# Patient Record
Sex: Female | Born: 1980 | Race: White | Hispanic: No | Smoking: Never smoker
Health system: Southern US, Community
[De-identification: ages and names within clinical notes are randomized; demographics above are authoritative.]

## PROBLEM LIST (undated history)

## (undated) DIAGNOSIS — C6990 Malignant neoplasm of unspecified site of unspecified eye: Secondary | ICD-10-CM

## (undated) HISTORY — PX: BREAST BIOPSY: SHX20

---

## 2016-09-05 DIAGNOSIS — R590 Localized enlarged lymph nodes: Secondary | ICD-10-CM | POA: Insufficient documentation

## 2016-09-05 DIAGNOSIS — C6932 Malignant neoplasm of left choroid: Secondary | ICD-10-CM | POA: Insufficient documentation

## 2018-04-20 DIAGNOSIS — G479 Sleep disorder, unspecified: Secondary | ICD-10-CM | POA: Insufficient documentation

## 2018-04-20 DIAGNOSIS — I7300 Raynaud's syndrome without gangrene: Secondary | ICD-10-CM | POA: Insufficient documentation

## 2018-04-20 DIAGNOSIS — C6992 Malignant neoplasm of unspecified site of left eye: Secondary | ICD-10-CM | POA: Insufficient documentation

## 2018-04-20 DIAGNOSIS — Z975 Presence of (intrauterine) contraceptive device: Secondary | ICD-10-CM | POA: Insufficient documentation

## 2018-04-20 DIAGNOSIS — Z9889 Other specified postprocedural states: Secondary | ICD-10-CM | POA: Insufficient documentation

## 2018-09-05 ENCOUNTER — Other Ambulatory Visit: Payer: Self-pay | Admitting: Medical Oncology

## 2018-09-05 DIAGNOSIS — C6932 Malignant neoplasm of left choroid: Secondary | ICD-10-CM

## 2018-09-26 ENCOUNTER — Ambulatory Visit
Admission: RE | Admit: 2018-09-26 | Discharge: 2018-09-26 | Disposition: A | Discharge status: Home / Self Care | Payer: PRIVATE HEALTH INSURANCE | Source: Ambulatory Visit | Attending: Medical Oncology | Admitting: Medical Oncology

## 2018-09-26 ENCOUNTER — Other Ambulatory Visit: Payer: Self-pay | Admitting: Medical Oncology

## 2018-09-26 ENCOUNTER — Ambulatory Visit
Admission: RE | Admit: 2018-09-26 | Discharge: 2018-09-26 | Disposition: A | Discharge status: Home / Self Care | Payer: Self-pay | Source: Ambulatory Visit | Attending: Medical Oncology | Admitting: Medical Oncology

## 2018-09-26 DIAGNOSIS — C6932 Malignant neoplasm of left choroid: Secondary | ICD-10-CM

## 2018-09-26 MED ORDER — GADOXETATE DISODIUM 0.25 MMOL/ML IV SOLN
7.0000 mL | Freq: Once | INTRAVENOUS | Status: AC | PRN
  Administered 2018-09-26: 7 mL via INTRAVENOUS

## 2019-04-10 ENCOUNTER — Other Ambulatory Visit: Payer: Self-pay | Admitting: Medical Oncology

## 2019-04-10 DIAGNOSIS — C6932 Malignant neoplasm of left choroid: Secondary | ICD-10-CM

## 2019-04-15 ENCOUNTER — Other Ambulatory Visit: Payer: Self-pay | Admitting: Medical Oncology

## 2019-04-19 ENCOUNTER — Inpatient Hospital Stay: Admission: RE | Admit: 2019-04-19 | Payer: PRIVATE HEALTH INSURANCE | Source: Ambulatory Visit

## 2019-04-19 ENCOUNTER — Other Ambulatory Visit: Payer: PRIVATE HEALTH INSURANCE

## 2019-05-03 ENCOUNTER — Ambulatory Visit
Admission: RE | Admit: 2019-05-03 | Discharge: 2019-05-03 | Disposition: A | Discharge status: Home / Self Care | Payer: PRIVATE HEALTH INSURANCE | Source: Ambulatory Visit | Attending: Medical Oncology | Admitting: Medical Oncology

## 2019-05-03 ENCOUNTER — Other Ambulatory Visit: Payer: Self-pay

## 2019-05-03 DIAGNOSIS — C6932 Malignant neoplasm of left choroid: Secondary | ICD-10-CM

## 2019-05-03 MED ORDER — GADOXETATE DISODIUM 0.25 MMOL/ML IV SOLN
7.0000 mL | Freq: Once | INTRAVENOUS | Status: AC | PRN
  Administered 2019-05-03: 7 mL via INTRAVENOUS

## 2019-10-21 ENCOUNTER — Other Ambulatory Visit: Payer: Self-pay | Admitting: Family

## 2019-10-21 DIAGNOSIS — C6932 Malignant neoplasm of left choroid: Secondary | ICD-10-CM

## 2019-10-22 ENCOUNTER — Ambulatory Visit
Admission: RE | Admit: 2019-10-22 | Discharge: 2019-10-22 | Disposition: A | Discharge status: Home / Self Care | Payer: PRIVATE HEALTH INSURANCE | Source: Ambulatory Visit | Attending: Family | Admitting: Family

## 2019-10-22 ENCOUNTER — Other Ambulatory Visit: Payer: Self-pay

## 2019-10-22 DIAGNOSIS — C6932 Malignant neoplasm of left choroid: Secondary | ICD-10-CM

## 2019-10-22 MED ORDER — GADOXETATE DISODIUM 0.25 MMOL/ML IV SOLN
7.0000 mL | Freq: Once | INTRAVENOUS | Status: AC | PRN
  Administered 2019-10-22: 7 mL via INTRAVENOUS

## 2019-10-25 ENCOUNTER — Encounter (HOSPITAL_COMMUNITY): Payer: Self-pay

## 2019-10-25 ENCOUNTER — Other Ambulatory Visit: Payer: Self-pay

## 2019-10-25 ENCOUNTER — Ambulatory Visit (HOSPITAL_COMMUNITY)
Admission: EM | Admit: 2019-10-25 | Discharge: 2019-10-25 | Disposition: A | Discharge status: Home / Self Care | Payer: PRIVATE HEALTH INSURANCE | Attending: Urgent Care | Admitting: Urgent Care

## 2019-10-25 DIAGNOSIS — Z20822 Contact with and (suspected) exposure to covid-19: Secondary | ICD-10-CM

## 2019-10-25 DIAGNOSIS — Z20828 Contact with and (suspected) exposure to other viral communicable diseases: Secondary | ICD-10-CM

## 2019-10-25 HISTORY — DX: Malignant neoplasm of unspecified site of unspecified eye: C69.90

## 2019-10-25 NOTE — ED Triage Notes (Signed)
Pt presents for covid testing with no symptoms; for travel.

## 2019-10-25 NOTE — ED Provider Notes (Signed)
  [place]   MRN: [id number] DOB: [birth year]  Subjective:   Erin Lee is a 38 y.o. female presenting for COVID-19 testing.  She has some exposure to the general public.  Patient is planning on traveling next week and is required to have COVID-19 testing.  Denies taking any chronic medications.   Allergies  Allergen Reactions  . Sulfa Antibiotics     Past Medical History:  Diagnosis Date  . Eye cancer [facility])      Denies pmh, psh.   Social History   Tobacco Use  . Smoking status: Never Smoker  . Smokeless tobacco: Never Used  Substance Use Topics  . Alcohol use: Not on file  . Drug use: Not on file    Review of Systems  Constitutional: Negative for fever and malaise/fatigue.  HENT: Negative for congestion, ear pain, sinus pain and sore throat.   Eyes: Negative for discharge and redness.  Respiratory: Negative for cough, hemoptysis, shortness of breath and wheezing.   Cardiovascular: Negative for chest pain.  Gastrointestinal: Negative for abdominal pain, diarrhea, nausea and vomiting.  Genitourinary: Negative for dysuria, flank pain and hematuria.  Musculoskeletal: Negative for myalgias.  Skin: Negative for rash.  Neurological: Negative for dizziness, weakness and headaches.  Psychiatric/Behavioral: Negative for depression and substance abuse.     Objective:   Vitals: BP 124/77 (BP Location: Left Arm)   Pulse 68   Temp 98.5 F (36.9 C) (Oral)   Resp 18   SpO2 100%   Physical Exam Constitutional:      General: She is not in acute distress.    Appearance: Normal appearance. She is well-developed. She is not ill-appearing.  HENT:     Head: Normocephalic and atraumatic.     Nose: Nose normal.     Mouth/Throat:     Mouth: Mucous membranes are moist.     Pharynx: Oropharynx is clear.  Eyes:     General: No scleral icterus.    Extraocular Movements: Extraocular movements intact.     Pupils: Pupils are equal, round, and reactive to light.   Cardiovascular:     Rate and Rhythm: Normal rate.  Pulmonary:     Effort: Pulmonary effort is normal.  Skin:    General: Skin is warm and dry.  Neurological:     General: No focal deficit present.     Mental Status: She is alert and oriented to person, place, and time.  Psychiatric:        Mood and Affect: Mood normal.        Behavior: Behavior normal.      Assessment and Plan :   1. Exposure to COVID-19 virus     Counseled patient on nature of COVID-19 including modes of transmission, diagnostic testing, management and supportive care.  Counseled on medications used for symptomatic relief. COVID 19 testing is pending. Counseled patient on potential for adverse effects with medications prescribed/recommended today, ER and return-to-clinic precautions discussed, patient verbalized understanding.     Jaynee Eagles, PA-C 10/25/19 1559

## 2019-10-25 NOTE — Discharge Instructions (Signed)
We will let you know about your COVID results as soon as they are available.

## 2019-10-27 LAB — NOVEL CORONAVIRUS, NAA (HOSP ORDER, SEND-OUT TO REF LAB; TAT 18-24 HRS): SARS-CoV-2, NAA: NOT DETECTED

## 2019-12-16 ENCOUNTER — Other Ambulatory Visit: Payer: Self-pay | Admitting: Obstetrics and Gynecology

## 2019-12-16 ENCOUNTER — Other Ambulatory Visit: Payer: Self-pay | Admitting: Obstetrics & Gynecology

## 2019-12-16 DIAGNOSIS — Z1231 Encounter for screening mammogram for malignant neoplasm of breast: Secondary | ICD-10-CM

## 2020-01-24 ENCOUNTER — Ambulatory Visit
Admission: RE | Admit: 2020-01-24 | Discharge: 2020-01-24 | Disposition: A | Discharge status: Home / Self Care | Payer: BC Managed Care – PPO | Source: Ambulatory Visit | Attending: Obstetrics and Gynecology | Admitting: Obstetrics and Gynecology

## 2020-01-24 ENCOUNTER — Other Ambulatory Visit: Payer: Self-pay

## 2020-01-24 DIAGNOSIS — Z1231 Encounter for screening mammogram for malignant neoplasm of breast: Secondary | ICD-10-CM

## 2020-04-13 ENCOUNTER — Ambulatory Visit (INDEPENDENT_AMBULATORY_CARE_PROVIDER_SITE_OTHER): Payer: BC Managed Care – PPO

## 2020-04-13 ENCOUNTER — Encounter: Payer: Self-pay | Admitting: Podiatry

## 2020-04-13 ENCOUNTER — Ambulatory Visit: Payer: BC Managed Care – PPO | Admitting: Podiatry

## 2020-04-13 ENCOUNTER — Telehealth: Payer: Self-pay | Admitting: *Deleted

## 2020-04-13 ENCOUNTER — Other Ambulatory Visit: Payer: Self-pay

## 2020-04-13 DIAGNOSIS — M79672 Pain in left foot: Secondary | ICD-10-CM | POA: Diagnosis not present

## 2020-04-13 DIAGNOSIS — M7989 Other specified soft tissue disorders: Secondary | ICD-10-CM

## 2020-04-13 DIAGNOSIS — T1490XA Injury, unspecified, initial encounter: Secondary | ICD-10-CM

## 2020-04-13 DIAGNOSIS — H32 Chorioretinal disorders in diseases classified elsewhere: Secondary | ICD-10-CM | POA: Insufficient documentation

## 2020-04-13 DIAGNOSIS — B399 Histoplasmosis, unspecified: Secondary | ICD-10-CM | POA: Insufficient documentation

## 2020-04-13 NOTE — Telephone Encounter (Signed)
-----   Message from Trula Slade, DPM sent at 04/13/2020 11:45 AM EDT ----- Can you please put in for an MRI of the left foot with contrast due to soft tissue mass in the 1st interspace? She has a rare melaoma of the eye and I want to make sure this is a benign mass. If she is unable to get it done in a timely fashion at [place] then can we try [facility]? Thanks.

## 2020-04-13 NOTE — Patient Instructions (Signed)
I have ordered a MRI of the left foot with contrast. If you do not hear for them about scheduling within the next 1 week, or you have any questions please give [place] a call at [phone].

## 2020-04-15 ENCOUNTER — Telehealth: Payer: Self-pay | Admitting: *Deleted

## 2020-04-15 ENCOUNTER — Telehealth: Payer: Self-pay | Admitting: Podiatry

## 2020-04-15 NOTE — Telephone Encounter (Signed)
Called and spoke with the representative Joylene Igo at [facility]) and the procedure code [id number] was approved and the authorization number is [id number] and is valid 04-15-2020 thru 10/11/2020. Lattie Haw

## 2020-04-15 NOTE — Telephone Encounter (Signed)
Please follow up with patient about STAT MRI  From [facility] pt would like to know if a ultra sound would be better she informed me that she will be having an MRI w/contrast for her liver she would like to be able to do it with out contrast, if Dr.Wagoner really thinks that a MRI would be best pt stated she will do what he suggest. Please assist

## 2020-04-15 NOTE — Progress Notes (Signed)
Subjective:   Patient ID: Erin Lee, female   DOB: 39 y.o.   MRN: [id number]   HPI 39 year old female presents the office today for concerns of her first and second toes separating when she stands up she notices a lump pops out between her toes.  She does have a history of an injury where she kicked a banister on January 12, 2020.  This area started after.  It does become uncomfortable with a shoe and shoe as she states it feels tight.  She said no recent treatment.  No redness or warmth.  No other issues today.  Review of Systems  All other systems reviewed and are negative.  Past Medical History:  Diagnosis Date  . Eye cancer [facility])     Past Surgical History:  Procedure Laterality Date  . BREAST BIOPSY Left    benign     Current Outpatient Medications:  .  Bevacizumab (AVASTIN) 100 MG/4ML SOLN, by Intravitreal route., Disp: , Rfl:  .  povidone-iodine (BETADINE) 5 % ophthalmic solution, To be administered by M.D., Disp: , Rfl:  .  tetracaine (PONTOCAINE) 0.5 % ophthalmic solution, To be administered by M.D., Disp: , Rfl:  .  desvenlafaxine (PRISTIQ) 50 MG 24 hr tablet, Take 50 mg by mouth daily., Disp: , Rfl:  .  LORazepam (ATIVAN) 0.5 MG tablet, Take 0.25-0.5 mg by mouth 2 (two) times daily as needed., Disp: , Rfl:  .  Polyethyl Glycol-Propyl Glycol 0.4-0.3 % SOLN, Apply to eye., Disp: , Rfl:  .  tobramycin (TOBREX) 0.3 % ophthalmic solution, , Disp: , Rfl:  .  traZODone (DESYREL) 100 MG tablet, Take 100 mg by mouth at bedtime., Disp: , Rfl:  .  triamcinolone cream (KENALOG) 0.1 %, , Disp: , Rfl:   Allergies  Allergen Reactions  . Sulfa Antibiotics   . Sulfasalazine           Objective:  Physical Exam  General: AAO x3, NAD  Dermatological: Skin is warm, dry and supple bilateral. Nails x 10 are well manicured; remaining integument appears unremarkable at this time. There are no open sores, no preulcerative lesions, no rash or signs of infection present.  Vascular:  Dorsalis Pedis artery and Posterior Tibial artery pedal pulses are 2/4 bilateral with immedate capillary fill time. Pedal hair growth present. No varicosities and no lower extremity edema present bilateral. There is no pain with calf compression, swelling, warmth, erythema.   Neruologic: Grossly intact via light touch bilateral.  Musculoskeletal: Upon weightbearing evaluation for first and second toes to separate you are able to palpate a fluid filled mass present on the first interspace which does seem to bulge distally.  Mild tenderness palpation.  Also nonweightbearing exam we can palpate a soft tissue mass present.  No area of pinpoint tenderness.  Flexor, extensor tendons appear to be intact.  Muscular strength 5/5 in all groups tested bilateral.  Gait: Unassisted, Nonantalgic.       Assessment:   Soft tissue mass left foot    Plan:  -Treatment options discussed including all alternatives, risks, and complications -Etiology of symptoms were discussed -X-rays were obtained and reviewed with the patient.  There is no evidence of acute fracture.  No definitive calcifications are identified.  On the lateral aspect the first metatarsal head likely old chronic erosions noted but no definitive osseous abnormalities. -Given her medical history recommend MRI with contrast to further evaluate the mass.  We discussed aspiration of the area but when to get the MRI prior to  doing so.  This was ordered today.  No follow-ups on file.  Trula Slade DPM

## 2020-04-16 ENCOUNTER — Ambulatory Visit
Admission: RE | Admit: 2020-04-16 | Discharge: 2020-04-16 | Disposition: A | Discharge status: Home / Self Care | Payer: BC Managed Care – PPO | Source: Ambulatory Visit | Attending: Internal Medicine | Admitting: Internal Medicine

## 2020-04-16 ENCOUNTER — Other Ambulatory Visit: Payer: Self-pay | Admitting: Internal Medicine

## 2020-04-16 DIAGNOSIS — Z85840 Personal history of malignant neoplasm of eye: Secondary | ICD-10-CM

## 2020-04-16 NOTE — Telephone Encounter (Signed)
Erin Lee- Were you able to follow up on this today? I would prefer the MRI. We can start without contrast since she is getting another MRI with contrast if she would like. If we can get her in for an ultrasound before the MRI that is fine but recently I have had issues with getting ultrasounds done in a timely fashion.

## 2020-04-17 NOTE — Telephone Encounter (Signed)
Spoke with Joylene Igo from [place] ([place]) and the representative stated that the procedure code [id number] was approved and the authorization number is [id number] and is valid 04-15-2020 thru 10-11-2020. Lattie Haw

## 2020-04-20 NOTE — Telephone Encounter (Signed)
Pt called to follow up on MRI wanting to know what the status is she wanted  Any information please advise

## 2020-04-23 ENCOUNTER — Telehealth: Payer: Self-pay | Admitting: *Deleted

## 2020-04-23 ENCOUNTER — Other Ambulatory Visit: Payer: Self-pay | Admitting: Podiatry

## 2020-04-23 ENCOUNTER — Telehealth: Payer: Self-pay | Admitting: Podiatry

## 2020-04-23 DIAGNOSIS — M7989 Other specified soft tissue disorders: Secondary | ICD-10-CM

## 2020-04-23 NOTE — Telephone Encounter (Signed)
error 

## 2020-04-23 NOTE — Progress Notes (Signed)
MRI ordered without contrast. Spoke to [employer] and they have an opening Sunday. They will call the patient. We will work on the pre-cert for this.

## 2020-04-23 NOTE — Telephone Encounter (Signed)
Called pt and she has set up her MRI and now wuill get an appt to have the results

## 2020-04-23 NOTE — Telephone Encounter (Signed)
Pt has called numerous times on 04/22/20 and was told that she could go ahead schedule appt for MRI pt called and still can not get a scheduled for MRI please advise the pt stated that [facility] will not do the MRI bc the order says W OR W/O contrast pt states she wants one with out contrast please advise

## 2020-04-23 NOTE — Telephone Encounter (Signed)
Dr. Jacqualyn Posey states pt requested MRI without contrast, and he will pre-cert for pt to have the MRI on Sunday.

## 2020-04-23 NOTE — Telephone Encounter (Signed)
Called and spoke with the representative from [place] and the authorization is still the same and the MRI w/o contrast is set for [facility] and the reference number is [id number]. Erin Lee

## 2020-04-26 ENCOUNTER — Ambulatory Visit (INDEPENDENT_AMBULATORY_CARE_PROVIDER_SITE_OTHER): Payer: BC Managed Care – PPO

## 2020-04-26 ENCOUNTER — Other Ambulatory Visit: Payer: Self-pay

## 2020-04-26 DIAGNOSIS — M7989 Other specified soft tissue disorders: Secondary | ICD-10-CM | POA: Diagnosis not present

## 2020-04-30 ENCOUNTER — Other Ambulatory Visit: Payer: Self-pay

## 2020-04-30 ENCOUNTER — Ambulatory Visit: Payer: BC Managed Care – PPO | Admitting: Podiatry

## 2020-04-30 ENCOUNTER — Encounter: Payer: Self-pay | Admitting: Podiatry

## 2020-04-30 DIAGNOSIS — M7752 Other enthesopathy of left foot: Secondary | ICD-10-CM | POA: Diagnosis not present

## 2020-04-30 DIAGNOSIS — M7989 Other specified soft tissue disorders: Secondary | ICD-10-CM

## 2020-05-02 LAB — SYNOVIAL FLUID, CRYSTAL

## 2020-05-02 NOTE — Progress Notes (Signed)
Subjective: 39 year old female presents the office today for evaluation of soft tissue mass of her left foot.  States that overall there is been no change. Denies any systemic complaints such as fevers, chills, nausea, vomiting. No acute changes since last appointment, and no other complaints at this time.   MRI 04/26/2020 IMPRESSION: A 7 x 12 x 27 mm well-circumscribed fluid collection between the first and second metatarsal heads coursing along the plantar aspect under the first metatarsal head with mild surrounding soft tissue edema. Overall appearance is most consistent with severe intermetatarsal bursitis.   Objective: AAO x3, NAD DP/PT pulses palpable bilaterally, CRT less than 3 seconds Overall exam is unchanged.  Soft tissue mass present on the first interspace of the left foot.  Mild tenderness palpation of the area.  There are no other areas of discomfort identified. No pain with calf compression, swelling, warmth, erythema  Assessment: Severe intermetatarsal bursitis  Plan: -All treatment options discussed with the patient including all alternatives, risks, complications.  -MRI was reviewed with her.  Discussed aspiration and steroid injection.  She wishes to proceed understanding risks.  Verbal consent was obtained.  The skin was prepped with alcohol and a mixture of 3 cc of lidocaine, Marcaine plain was infiltrated.  Once anesthetized the skin was cleaned with Betadine and the 18-gauge needle was utilized to aspirate thin, yellow fluid.  This was cultured as well sent for pathology.  I then infiltrated with half cc Kenalog 10, 0.5 cc of Marcaine plain.  Compression bandage was applied.  Post procedure instructions discussed.  No follow-ups on file.  Erin Lee DPM

## 2020-05-04 LAB — WOUND CULTURE
MICRO NUMBER:: 10506210
RESULT:: NO GROWTH
SPECIMEN QUALITY:: ADEQUATE

## 2020-05-19 ENCOUNTER — Other Ambulatory Visit: Payer: Self-pay

## 2020-05-19 ENCOUNTER — Ambulatory Visit (INDEPENDENT_AMBULATORY_CARE_PROVIDER_SITE_OTHER): Payer: BC Managed Care – PPO | Admitting: Orthotics

## 2020-05-19 DIAGNOSIS — M7752 Other enthesopathy of left foot: Secondary | ICD-10-CM | POA: Diagnosis not present

## 2020-05-19 DIAGNOSIS — M7989 Other specified soft tissue disorders: Secondary | ICD-10-CM | POA: Diagnosis not present

## 2020-05-19 NOTE — Progress Notes (Signed)
patietn cast for f/o to address intermetatarsal bursitis/metarasalgia.

## 2020-05-20 ENCOUNTER — Encounter: Payer: Self-pay | Admitting: Podiatry

## 2020-06-09 ENCOUNTER — Ambulatory Visit: Payer: BC Managed Care – PPO | Admitting: Orthotics

## 2020-06-09 ENCOUNTER — Other Ambulatory Visit: Payer: Self-pay

## 2020-06-09 DIAGNOSIS — M7752 Other enthesopathy of left foot: Secondary | ICD-10-CM

## 2020-06-09 DIAGNOSIS — M7989 Other specified soft tissue disorders: Secondary | ICD-10-CM

## 2020-06-09 NOTE — Progress Notes (Signed)
Patient came in today to pick up custom made foot orthotics.  The goals were accomplished and the patient reported no dissatisfaction with said orthotics.  Patient was advised of breakin period and how to report any issues. 

## 2020-06-23 ENCOUNTER — Encounter: Payer: Self-pay | Admitting: Podiatry

## 2020-06-23 ENCOUNTER — Other Ambulatory Visit: Payer: Self-pay

## 2020-06-23 ENCOUNTER — Ambulatory Visit (INDEPENDENT_AMBULATORY_CARE_PROVIDER_SITE_OTHER): Payer: BC Managed Care – PPO | Admitting: Podiatry

## 2020-06-23 ENCOUNTER — Other Ambulatory Visit: Payer: Self-pay | Admitting: Podiatry

## 2020-06-23 DIAGNOSIS — M7989 Other specified soft tissue disorders: Secondary | ICD-10-CM

## 2020-06-24 ENCOUNTER — Telehealth: Payer: Self-pay | Admitting: Podiatry

## 2020-06-24 NOTE — Telephone Encounter (Signed)
Danny from [facility] called and wanted to know what test that was suppose to be performed on sample please advise danny contact # [phone]

## 2020-06-24 NOTE — Telephone Encounter (Signed)
error 

## 2020-06-24 NOTE — Progress Notes (Signed)
Subjective: 39 year old female presents the office today for follow-up for recent soft tissue mass on her left foot between first and second toes.  She states that it did go down to the previous aspiration however did fill back up in her toes and then separating.  She also started the area drained again.  No other concerns today.Denies any systemic complaints such as fevers, chills, nausea, vomiting. No acute changes since last appointment, and no other complaints at this time.   Objective: AAO x3, NAD DP/PT pulses palpable bilaterally, CRT less than 3 seconds Soft tissue mass present in the first interspace most notably when standing.  Fluid filled soft tissue mass is present with mild tenderness.  No erythema or warmth.  No area pinpoint tenderness. No pain with calf compression, swelling, warmth, erythema  Assessment: Left foot soft tissue mass  Plan: -All treatment options discussed with the patient including all alternatives, risks, complications.  -Discussed repeat aspiration, steroid injection.  She wished to proceed with this.  Skin was cleaned alcohol mixture of 3 cc of lidocaine, Marcaine plain was infiltrated in a regional block fashion.  Once anesthetized skin was prepped with Betadine.  An 18-gauge needle was utilized to aspirate clear/yellow fluid.  There is no purulence or signs of infection.  Soft tissue mass appear to be improved.  Mixture of 0.5cc Kenalog 10, 0.5 cc of Marcaine plain was infiltrated into the lesion.  Compression bandage applied.  Post procedure dressings discussed.  Monitoring signs or symptoms of infection. -Patient encouraged to call the office with any questions, concerns, change in symptoms.   Trula Slade DPM

## 2020-06-25 NOTE — Telephone Encounter (Signed)
Called and left a message for Erin Lee to call me in the [place] office [phone]. Lattie Haw

## 2020-06-26 NOTE — Telephone Encounter (Signed)
Spoke with [facility] today and got the test that I needed to have done. Lattie Haw

## 2020-06-29 LAB — CYTOLOGY - NON PAP

## 2020-06-29 LAB — NON-GYN, SPECIMEN A

## 2020-06-30 ENCOUNTER — Encounter: Payer: Self-pay | Admitting: Podiatry

## 2020-07-03 ENCOUNTER — Other Ambulatory Visit: Payer: Self-pay

## 2020-07-03 ENCOUNTER — Ambulatory Visit: Payer: BC Managed Care – PPO | Admitting: Orthotics

## 2020-07-03 DIAGNOSIS — M7989 Other specified soft tissue disorders: Secondary | ICD-10-CM

## 2020-07-03 NOTE — Progress Notes (Signed)
Added valgus wedge to control supination

## 2020-08-06 ENCOUNTER — Other Ambulatory Visit: Payer: Self-pay

## 2020-08-06 ENCOUNTER — Ambulatory Visit: Payer: BC Managed Care – PPO | Admitting: Podiatry

## 2020-08-06 DIAGNOSIS — M7752 Other enthesopathy of left foot: Secondary | ICD-10-CM | POA: Diagnosis not present

## 2020-08-06 DIAGNOSIS — M7989 Other specified soft tissue disorders: Secondary | ICD-10-CM | POA: Diagnosis not present

## 2020-08-09 NOTE — Progress Notes (Signed)
Subjective: 39 year old female presents the office today for concerns of recurrent cyst on the first interspace of the left foot that should have drained.  She does not like proceed with any surgical intervention.  Although it somewhat smaller compared to what it has been still present and causing discomfort.  She has not had a steroid injection afterwards that she was not convinced this was helpful.  Denies any systemic complaints such as fevers, chills, nausea, vomiting. No acute changes since last appointment, and no other complaints at this time.   Objective: AAO x3, NAD DP/PT pulses palpable bilaterally, CRT less than 3 seconds On the first interspace on the left foot fluid-filled soft tissue mass.  Does appear to be smaller compared to what it has been previously but still evident.  She still gets discomfort in the area when the fluid comes back.  No other area discomfort. No pain with calf compression, swelling, warmth, erythema  Assessment: Bursa left foot  Plan: -All treatment options discussed with the patient including all alternatives, risks, complications.  -Skin was cleaned with alcohol and mixture of 3 cc lidocaine, Marcaine plain was infiltrated in a regional block fashion.  The skin was cleaned and prepped with Betadine and an 18-gauge needle was utilized to aspirate clear, yellow fluid from the area and appears to be the same color is what it has been previously.  Is able to move about 2 cc.  Compression bandage applied.  Tolerated well. -Patient encouraged to call the office with any questions, concerns, change in symptoms.   Trula Slade DPM

## 2020-12-08 ENCOUNTER — Ambulatory Visit: Payer: BC Managed Care – PPO | Admitting: Podiatry

## 2020-12-08 ENCOUNTER — Other Ambulatory Visit: Payer: Self-pay

## 2020-12-08 DIAGNOSIS — M79672 Pain in left foot: Secondary | ICD-10-CM | POA: Diagnosis not present

## 2020-12-08 DIAGNOSIS — M7752 Other enthesopathy of left foot: Secondary | ICD-10-CM

## 2020-12-11 NOTE — Progress Notes (Signed)
Subjective: 39 year old female presents the office today for concerns of recurrent cyst on the first interspace of the left foot.  She has been doing physical therapy and dry needling is been helpful.  The area is smaller however there is still fluid causing discomfort. Denies any systemic complaints such as fevers, chills, nausea, vomiting. No acute changes since last appointment, and no other complaints at this time.   This issue started after she hit the banister on January 12, 2020.  She noticed the swelling and became uncomfortable with shoes.  MRI left foot: A 7 x 12 x 27 mm well-circumscribed fluid collection between the first and second metatarsal heads coursing along the plantar aspect under the first metatarsal head with mild surrounding soft tissue edema. Overall appearance is most consistent with severe intermetatarsal bursitis.  Objective: AAO x3, NAD DP/PT pulses palpable bilaterally, CRT less than 3 seconds On the first interspace on the left foot fluid-filled soft tissue mass.  It is smaller compared to when we first started however there is still fluid collection identified.  There is still tenderness palpation of the area.  No other areas of discomfort. No pain with calf compression, swelling, warmth, erythema  Assessment: Bursa left foot  Plan: -All treatment options discussed with the patient including all alternatives, risks, complications.  -At this point I discussed trying to aspirate the area under ultrasound guidance.  Unfortunately cannot do this in our office.  We will try to refer to sports medicine for this. -Once we can further aspirate this under ultrasound guidance possible return to physical therapy for dry needling. -Discussed surgical intervention but we both agree to hold off on that for now.  Vivi Barrack DPM

## 2020-12-22 ENCOUNTER — Encounter: Payer: Self-pay | Admitting: Allergy & Immunology

## 2020-12-22 ENCOUNTER — Other Ambulatory Visit: Payer: Self-pay

## 2020-12-22 ENCOUNTER — Ambulatory Visit: Payer: BC Managed Care – PPO | Admitting: Allergy & Immunology

## 2020-12-22 VITALS — BP 126/84 | HR 69 | Temp 98.5°F | Resp 18 | Ht 67.5 in | Wt 167.6 lb

## 2020-12-22 DIAGNOSIS — J301 Allergic rhinitis due to pollen: Secondary | ICD-10-CM

## 2020-12-22 DIAGNOSIS — T63481D Toxic effect of venom of other arthropod, accidental (unintentional), subsequent encounter: Secondary | ICD-10-CM | POA: Diagnosis not present

## 2020-12-22 DIAGNOSIS — T8090XD Unspecified complication following infusion and therapeutic injection, subsequent encounter: Secondary | ICD-10-CM

## 2020-12-22 NOTE — Progress Notes (Signed)
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 Possible allergy to fluorescein - I am going to get some records to see what exactly it was that you reacted to. - I can do some literature review.    2. Seasonal rhinitis  - Testing today showed: grasses - Copy of test results provided.  - Avoidance measures provided. - Continue with: all of your current medications since you seem to have this under good control  3. Stinging insect allergy - We are going to get some blood work to see if you have evidence of allergies to stinging insects. - We will call you in 1-2 weeks. - We are also going to get a serum tryptase to look for evidence of mast cell Lee.   4. Return in about 3 months (around 03/22/2021).    Subjective:   Erin Lee is a 40 y.o. female presenting today for evaluation of  Chief Complaint  Patient presents with  . Allergic Reaction    Fluorescin dye  . Allergic Rhinitis     Erin Lee has a history of the following: Patient Active Problem List   Diagnosis Date Noted  . 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 History of loop electrosurgical excision procedure (LEEP) of cervix 04/20/2018  . 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 Erin Lee 04/20/2018  . Sleep difficulties 04/20/2018  . Presence of intrauterine contraceptive device (IUD) 04/20/2018  . Rosanna Randy Lee 09/05/2016  . Hyperbilirubinemia 09/05/2016  . Inguinal adenopathy 09/05/2016  . 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  Erin Lee was referred by  Seward Carol, MD.     Erin Lee is a 40 y.o. female presenting for an evaluation of allergic reactions to dye.  She has a history of eye cancer (diagnosed at age 56). She had eye injections to prevent the damage. She lost most of the vision in the left eye. This was all in [place]. She had extensive testing performed and it was apparently low risk.  She gets IV fluorescin injections to be able to take pictures of her eye vasculature. Her reaction included itching around the site of injection. It started at the site of her infusion and she got hives over her hands. She had itching essentially over her entire body. The itching started the following day. Then by Thursday and Friday, she was broken out in hives for two weeks total. She is unsure whether she is going to  She sees Dr. Baird Cancer at [facility]. This is at [facility] before [place]. She was supposed to be getting it twice per year.   She does get regular injections for MRIs. She has this done every 6 months and does fine with this dye. She had her MRI in December 2021 and did fine with that.  She had a job at [employer]. 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 She is working at [employer]. 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 Her partner works at [employer] in the [employer].   Allergic Rhinitis Symptom History: She does report some irritation during the pollen season.  She has never been tested for environmental  allergies. She uses Flonase when it gets really. She does not use any antihistamines.    She has been stung by wasps in the past. She apparently was running a race and disturbed a nest in the ground. She reports that she was stung with over ten wasps, but did nto have anaphylaxis. She did have some hives around the site of the stings. She has not been stung since that time.   Otherwise, there is no history of other atopic diseases, including food allergies, drug allergies,  stinging insect allergies, eczema, urticaria or contact dermatitis. There is no significant infectious history. Vaccinations are up to date.    Past Medical History: Patient Active Problem List   Diagnosis Date Noted  . 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 History of loop electrosurgical excision procedure (LEEP) of cervix 04/20/2018  . Ocular melanoma, left ([place]) 04/20/2018  . Erin Lee 04/20/2018  . Sleep difficulties 04/20/2018  . Presence of intrauterine contraceptive device (IUD) 04/20/2018  . Rosanna Randy Lee 09/05/2016  . Hyperbilirubinemia 09/05/2016  . Inguinal adenopathy 09/05/2016  . 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 If you have any questions, ask your nurse or doctor.        STOP taking these medications   Avastin 100 MG/4ML Soln Generic drug: Bevacizumab Stopped by: Valentina Shaggy, MD   Melatonin 10 MG Tabs Stopped by: Valentina Shaggy, MD   Polyethyl Glycol-Propyl Glycol 0.4-0.3 % Soln Stopped by: Valentina Shaggy, MD   povidone-iodine 5 % ophthalmic solution Commonly known as: BETADINE Stopped by: Valentina Shaggy, MD   tetracaine 0.5 % ophthalmic solution Commonly known as: PONTOCAINE Stopped by: Valentina Shaggy, MD   tobramycin 0.3 % ophthalmic solution Commonly known as: TOBREX Stopped by: Valentina Shaggy, MD     TAKE these medications   desvenlafaxine 50 MG 24 hr tablet Commonly known as: PRISTIQ Take by mouth. What changed: Another medication with the same name was removed. Continue taking this medication, and follow the directions you see here. Changed by: Valentina Shaggy, MD   LORazepam 0.5 MG tablet Commonly known as: ATIVAN Take 0.25-0.5 mg by mouth 2 (two) times  daily as needed.   Ozurdex 0.7 MG Impl Generic drug: dexamethasone 0.7 mg by Intravitreal route once.   traZODone 100 MG tablet Commonly known as: DESYREL Take 100 mg by mouth at bedtime.   triamcinolone 0.1 % Commonly known as: KENALOG       Birth History: non-contributory  Developmental History: non-contributory  Past Surgical History: Past Surgical History:  Procedure Laterality Date  . BREAST BIOPSY Left    benign     Family History: History reviewed. No pertinent family history.   Social History: Mallori lives at home with her partner. There are five cats in the home. They live in a house that is 49+ years old. She is unsure of any mold or mildew in the home. There are wood flooring throughout the home. There is gas heating and central cooling with window units and fans. There are no dust mite coverings on the bedding. 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 She is not exposed to fumes, chemicals, or dust. There is no smoking exposure at all.    Review of Systems  Constitutional: Negative.  Negative for chills, fever, malaise/fatigue and weight loss.  HENT: Negative.  Negative for congestion, ear discharge, ear pain and sore throat.   Eyes: Negative for pain, discharge and redness.  Respiratory: Negative for cough, sputum production, shortness of breath and wheezing.   Cardiovascular: Negative.  Negative for chest pain and palpitations.  Gastrointestinal: Negative for abdominal pain, constipation, diarrhea, heartburn, nausea and vomiting.  Skin: Positive for itching and rash.  Neurological: Negative for dizziness and headaches.  Endo/Heme/Allergies: Negative for environmental allergies. Does not bruise/bleed easily.       Objective:   Blood pressure 126/84, pulse 69, temperature 98.5 F (36.9 C), temperature source Temporal, resp. rate 18, height 5' 7.5" (1.715 m), weight 167 lb 9.6 oz (76 kg), SpO2 99 %. Body mass index is  25.86 kg/m.   Physical Exam:   Physical Exam Constitutional:      Appearance: She is well-developed.  HENT:     Head: Normocephalic and atraumatic.     Right Ear: Tympanic membrane, ear canal and external ear normal.     Left Ear: Tympanic membrane, ear canal and external ear normal.     Nose: No nasal deformity, septal deviation, mucosal edema, rhinorrhea or epistaxis.     Right Turbinates: Enlarged and swollen.     Left Turbinates: Enlarged and swollen.     Right Sinus: No maxillary sinus tenderness or frontal sinus tenderness.     Left Sinus: No maxillary sinus tenderness or frontal sinus tenderness.     Mouth/Throat:     Mouth: Oropharynx is clear and moist. Mucous membranes are not pale and not dry.     Pharynx: Uvula midline.  Eyes:     General:        Right eye: No discharge.        Left eye: No discharge.     Extraocular Movements: EOM normal.     Conjunctiva/sclera: Conjunctivae normal.     Right eye: Right conjunctiva is not injected. No chemosis.    Left eye: Left conjunctiva is not injected. No chemosis.    Pupils: Pupils are equal, round, and reactive to light.  Cardiovascular:     Rate and Rhythm: Normal rate and regular rhythm.     Heart sounds: Normal heart sounds.  Pulmonary:     Effort: Pulmonary effort is normal. No tachypnea, accessory muscle usage or respiratory distress.     Breath sounds: Normal breath sounds. No wheezing, rhonchi or rales.     Comments: Moving air well in all lung fields. No increased work of breathing noted.  Chest:     Chest wall: No tenderness.  Lymphadenopathy:     Cervical: No cervical adenopathy.  Skin:    General: Skin is warm.     Capillary Refill: Capillary refill takes less than 2 seconds.     Coloration: Skin is not pale.     Findings: No abrasion, erythema, petechiae or rash. Rash is not papular, urticarial or vesicular.  Neurological:     Mental Status: She is alert.  Psychiatric:        Mood and Affect: Mood and  affect normal.        Behavior: Behavior is cooperative.      Diagnostic studies:   Allergy Studies:     Airborne Adult Perc - 12/22/20 1424  Time Antigen Placed 1424    Allergen Manufacturer Greer    Location Back    Number of Test 59    Panel 1 Select    1. Control-Buffer 50% Glycerol Negative    2. Control-Histamine 1 mg/ml 2+    3. Albumin saline Negative    4. [place] 3+    5. [place] 3+    6. Johnson Negative    7. Northfork Blue Negative    8. Meadow Fescue Negative    9. Perennial Rye Negative    10. Sweet Vernal Negative    11. Timothy Negative    12. Cocklebur Negative    13. Burweed Marshelder Negative    14. Ragweed, short Negative    15. Ragweed, Giant Negative    16. Plantain,  English Negative    17. Lamb's Quarters Negative    18. Sheep Sorrell Negative    19. Rough Pigweed Negative    20. Marsh Elder, Rough Negative    21. Mugwort, Common Negative    22. Ash mix Negative    23. Birch mix Negative    24. Beech American Negative    25. Box, Elder Negative    26. Cedar, red Negative    27. Cottonwood, [place] Negative    28. Elm mix Negative    29. Hickory Negative    30. Maple mix Negative    31. Oak, [place] mix Negative    32. Pecan Pollen Negative    33. Pine mix Negative    34. Sycamore Eastern Negative    35. [place]endale, Black Pollen Negative    36. Alternaria alternata Negative    37. Cladosporium Herbarum Negative    38. Aspergillus mix Negative    39. Penicillium mix Negative    40. Bipolaris sorokiniana (Helminthosporium) Negative    41. Drechslera spicifera (Curvularia) Negative    42. Mucor plumbeus Negative    43. Fusarium moniliforme Negative    44. Aureobasidium pullulans (pullulara) Negative    45. Rhizopus oryzae Negative    46. Botrytis cinera Negative    47. Epicoccum nigrum Negative    48. Phoma betae Negative    49. Candida Albicans Negative    50. Trichophyton mentagrophytes Negative    51. Mite, D Farinae  5,000 AU/ml  Negative    52. Mite, D Pteronyssinus  5,000 AU/ml Negative    53. Cat Hair 10,000 BAU/ml Negative    54.  Dog Epithelia Negative    55. Mixed Feathers Negative    56. Horse Epithelia Negative    57. Cockroach, German Negative    58. Mouse Negative    59. Tobacco Leaf Negative           Allergy testing results were read and interpreted by myself, documented by clinical staff.         Salvatore Marvel, MD Allergy and [place] [facility] [place]

## 2020-12-22 NOTE — Patient Instructions (Addendum)
1. Allergy to fluorescein - I am going to get some records to see what exactly it was that you reacted to. - I can do some literature review.    2. Seasonal rhinitis  - Testing today showed: grasses - Copy of test results provided.  - Avoidance measures provided. - Continue with: all of your current medications since you seem to have this unde good control  3. Stinging insect allergy - We are going to get some blood work to see if you have evidence of allergies to stinging insects. - We will call you in 1-2 weeks. - We are also going to get a serum tryptase to look for evidence of mast cell disease.   4. Return in about 3 months (around 03/22/2021).    Please inform us of any Emergency Department visits, hospitalizations, or changes in symptoms. Call us before going to the ED for breathing or allergy symptoms since we might be able to fit you in for a sick visit. Feel free to contact us anytime with any questions, problems, or concerns.  It was a pleasure to meet you today!  Websites that have reliable patient information: 1. American Academy of Asthma, Allergy, and Immunology: www.aaaai.org 2. Food Allergy Research and Education (FARE): foodallergy.org 3. Mothers of Asthmatics: http://www.asthmacommunitynetwork.org 4. American College of Allergy, Asthma, and Immunology: www.acaai.org   COVID-19 Vaccine Information can be found at: ShippingScam.co.uk For questions related to vaccine distribution or appointments, please email vaccine@[email or web address] [email or web address] or call [phone].     "Like" [place] on Facebook and Instagram for our latest updates!       Make sure you are registered to vote! If you have moved or changed any of your contact information, you will need to get this updated before voting!  In some cases, you MAY be able to register to vote online: [email or web address]

## 2020-12-25 LAB — ALLERGEN STINGING INSECT PANEL
Honeybee IgE: <0.1 kU/L
Hornet, White Face, IgE: <0.1 kU/L
Hornet, Yellow, IgE: <0.1 kU/L
Paper Wasp IgE: <0.1 kU/L
Yellow Jacket, IgE: <0.1 kU/L

## 2020-12-25 LAB — TRYPTASE: Tryptase: 3.7 ug/L (ref 2.2–13.2)

## 2021-01-12 ENCOUNTER — Encounter: Payer: Self-pay | Admitting: Podiatry

## 2021-02-02 ENCOUNTER — Ambulatory Visit: Payer: BC Managed Care – PPO | Admitting: Sports Medicine

## 2021-02-02 ENCOUNTER — Other Ambulatory Visit: Payer: Self-pay

## 2021-02-02 ENCOUNTER — Ambulatory Visit: Payer: Self-pay

## 2021-02-02 VITALS — BP 106/78 | Ht 67.0 in | Wt 155.0 lb

## 2021-02-02 DIAGNOSIS — M25572 Pain in left ankle and joints of left foot: Secondary | ICD-10-CM | POA: Diagnosis not present

## 2021-02-02 DIAGNOSIS — R2242 Localized swelling, mass and lump, left lower limb: Secondary | ICD-10-CM | POA: Diagnosis not present

## 2021-02-02 NOTE — Progress Notes (Signed)
SUBJECTIVE:   CHIEF COMPLAINT / HPI:   Left foot pain [name] is a new patient to this practice who is a very pleasant 40 year old female who is referred by Dr. Jacqualyn Posey from [facility] for continued left foot pain and swelling in between the first and second digits of the left foot. It began in January 2021 when she is doing handstand and she felt herself falling over so she used her left foot to try and catch her and she hit it and had immediate swelling, pain, and bruising. She rested it for a while and then noticed he did get better however in May she had some swelling that continued in between her great toe and the first digit on the left foot. She has a significant past medical history of she has had this area malignant melanoma of the choroid of the left eye so it MRI of the left foot was performed to show swelling and intermetatarsal bursitis with fluid accumulation between the first and second metatarsal heads on the left foot. She has had this area drained several times and unfortunately the fluid has returned as well as had a steroid injection which did not improve the swelling or pain. She has had custom orthotics made which unfortunately has also not improved her symptoms. She does have some dullness to sensation on the inside of the second digit of the left foot. She has also tried physical therapy and dry needling which did help some.  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  MSK: Examination of the left foot is significant for obvious splaying between the first and second digits of the left foot with swelling and fluid accumulation in the same location. It is slightly tender to palpation. Swelling and splaying increases with  weightbearing. Otherwise she has no other tenderness she has full range of motion of the left foot and ankle. Strength and sensation intact.  Limited MSK ultrasound: Left foot Significant hypoechoic area of fluid accumulation seen between the metatarsal heads of the first and second digits of the left foot. Also seen below the first metatarsal through the plantar surface. Impression: Fluid accumulation between the heads of the first and second metatarsals of the left foot consistent with intermetatarsal bursitis.  Ultrasound and interpretation by Dr. Garlan Fillers and Wolfgang Phoenix. Fields, MD   ASSESSMENT/PLAN:   Localized swelling of left foot The area of fluid collection and swelling was once again is aspirated today as notated above. Area was wrapped with gauze and Ace bandage to keep compression and hopefully prevent the bursa from swelling up again. Also made a slight adjustment to her orthotics to provide more support and prevent metatarsal heads from splaying out and hitting each other during activity causing recurrence of the swelling and bursitis. We will follow her back up in 3 weeks to see how she is doing.   Written and verbal consent was obtained after discussing the risks and benefits of the procedure with the patient. A timeout was performed and the correct site and side was identified and confirmed.  The area was cleaned in sterile fashion betadine and alcohol pad. 4 cc 1% Lidocaine was injected above the area  of swelling then under ultrasound guidance an 18 gauge needle was inserted into the hypoechoic fluid and about 5cc was of serosanginous gelatinous fluids was aspirated. Miniaml bleeding and patient tolerated the procedure well. Of note, the hypoechoic fluid collection was resolved after aspiration. I do think part of the fluid pocket was ruptured after lidocaine was injected as the needle likely rupture the cyst pocket.  Erin Alpha, DO PGY-4, Sports Medicine Fellow [place]  I observed and examined the patient with the [facility] resident and agree with assessment and plan.  Note reviewed and modified by me. Erin Mcgill, MD

## 2021-02-02 NOTE — Patient Instructions (Addendum)
It was great to meet you today Dr. Rolena Infante! Thank you for letting me participate in your care!  Today, we discussed your left foot pain and the continued fluid collection that is due to bursitis in your foot. We drained the fluid today. Please keep the ace wrap around your foot over the next 48 hours and then wear the new insole we gave you today with the padding. Follow up with [place] in 3 weeks and we will ultrasound the foot and see if the swelling has been reduced.   Be well, Harolyn Rutherford, DO PGY-4, Sports Medicine Fellow [place]

## 2021-02-02 NOTE — Assessment & Plan Note (Signed)
The area of fluid collection and swelling was once again is aspirated today as notated above. Area was wrapped with gauze and Ace bandage to keep compression and hopefully prevent the bursa from swelling up again. Also made a slight adjustment to her orthotics to provide more support and prevent metatarsal heads from splaying out and hitting each other during activity causing recurrence of the swelling and bursitis. We will follow her back up in 3 weeks to see how she is doing.

## 2021-02-16 ENCOUNTER — Other Ambulatory Visit: Payer: Self-pay

## 2021-02-16 ENCOUNTER — Ambulatory Visit (INDEPENDENT_AMBULATORY_CARE_PROVIDER_SITE_OTHER): Payer: BC Managed Care – PPO | Admitting: Podiatry

## 2021-02-16 DIAGNOSIS — M7989 Other specified soft tissue disorders: Secondary | ICD-10-CM

## 2021-02-16 DIAGNOSIS — M79672 Pain in left foot: Secondary | ICD-10-CM

## 2021-02-16 DIAGNOSIS — M7752 Other enthesopathy of left foot: Secondary | ICD-10-CM | POA: Diagnosis not present

## 2021-02-16 NOTE — Progress Notes (Signed)
Patient presents to be re-casted for orthotics.  A foam impression was casted for her right and left foot.  Patient will be contacted when the inserts are ready for pick up.

## 2021-02-23 ENCOUNTER — Ambulatory Visit: Payer: BC Managed Care – PPO | Admitting: Sports Medicine

## 2021-02-23 ENCOUNTER — Other Ambulatory Visit: Payer: Self-pay

## 2021-02-23 VITALS — BP 110/84 | Ht 67.0 in | Wt 155.0 lb

## 2021-02-23 DIAGNOSIS — R2242 Localized swelling, mass and lump, left lower limb: Secondary | ICD-10-CM | POA: Diagnosis not present

## 2021-02-23 NOTE — Patient Instructions (Signed)
Dr. Radene Journey [facility] [address] [place], [place] [id number]  Dr. Wylene Simmer [facility] [address]  [place], [place]

## 2021-02-24 NOTE — Progress Notes (Signed)
   Subjective:    Patient ID: Erin Lee, female    DOB: 13-Oct-1981, 40 y.o.   MRN: [id number]  HPI   Patient comes in today for follow-up intermetatarsal bursitis which was aspirated and injected under ultrasound on 02/02/2021.  Unfortunately, her swelling has returned.  She has had several injections in this area, none of which have been curative.  She has also tried custom orthotics.  She is very active and would like to continue with all of her current workouts, but her foot discomfort is very annoying.    Review of Systems Above    Objective:   Physical Exam  Well-developed, well-nourished.  No acute distress  Examination of the left foot shows a palpable fluctuant cystic mass between the first and second digits.  It is quite prominent with standing.  This results in splaying of her first and second toes as well.  No erythema.      Assessment & Plan:   Persistent left foot pain secondary to intermetatarsal bursitis  Patient has failed conservative treatment to date.  She likely needs surgical excision of this metatarsal bursa.  I think her postoperative recovery would be rather quick but she understands that there is always the chance of recurrence later on down the road.  At this point I would defer further treatment to the discretion of one of the surgeons and she will follow up with [place] as needed.

## 2021-03-10 ENCOUNTER — Other Ambulatory Visit: Payer: Self-pay | Admitting: Obstetrics & Gynecology

## 2021-03-10 ENCOUNTER — Other Ambulatory Visit: Payer: Self-pay | Admitting: Internal Medicine

## 2021-03-10 DIAGNOSIS — Z1231 Encounter for screening mammogram for malignant neoplasm of breast: Secondary | ICD-10-CM

## 2021-03-11 ENCOUNTER — Other Ambulatory Visit: Payer: Self-pay

## 2021-03-11 ENCOUNTER — Ambulatory Visit (INDEPENDENT_AMBULATORY_CARE_PROVIDER_SITE_OTHER): Payer: BC Managed Care – PPO

## 2021-03-11 DIAGNOSIS — M7752 Other enthesopathy of left foot: Secondary | ICD-10-CM

## 2021-03-11 NOTE — Progress Notes (Signed)
Patient arrived today to pick-up custom orthotics. Patient educated on the breaking-in process and how to properly wear the orthotics. Patient advised to call the office with any questions or concerns. Patient verbalized understanding.

## 2021-03-22 ENCOUNTER — Telehealth: Payer: Self-pay

## 2021-03-22 NOTE — Telephone Encounter (Signed)
Patient has been rescheduled and she is going to contact their office regarding the request for her medical records.   ===View-only below this line=== ----- Message ----- From: Valentina Shaggy, MD Sent: 03/22/2021  10:42 AM EDT To: Geanie Logan Neal-Mims, NT Subject: FW: Upcoming Appointment                        ----- Message ----- From: Valentina Shaggy, MD Sent: 03/20/2021   7:28 AM EDT To: Isaac Laud, [facility] Subject: RE: Upcoming Appointment                       I re-read her note and chart. I do not see the outside records from Dr. Baird Cancer ([facility]). I assume we must have filled out a release of information form for this?   Let's reschedule in one month and get our ducks in a row. I am glad that she called. I kept expecting some records to pass through my desk.  [place] [facility] - can someone look through the chart to make sure I did not miss anything? If not, can we get records from Dr. Baird [facility] at [facility]?   Salvatore Marvel, MD [facility]  ----- Message ----- From: Isaac Laud Sent: 03/19/2021  12:05 PM EDT To: Valentina Shaggy, MD Subject: Upcoming Appointment                           Patient called and said she has an appointment this coming Tuesday, 4/12 with you. She wanted to know if you had found out about the dye she reacted to and if not, she wants to know if she needs to still come in or if she needs to push her appointment out until you get that information.

## 2021-03-23 ENCOUNTER — Ambulatory Visit: Payer: BC Managed Care – PPO | Admitting: Allergy & Immunology

## 2021-04-08 ENCOUNTER — Other Ambulatory Visit: Payer: Self-pay | Admitting: Primary Care

## 2021-04-08 DIAGNOSIS — C6932 Malignant neoplasm of left choroid: Secondary | ICD-10-CM

## 2021-04-12 NOTE — Telephone Encounter (Signed)
I just need the dye ingredients.   Salvatore Marvel, MD Allergy and [place] [facility] [place]

## 2021-04-12 NOTE — Telephone Encounter (Signed)
Dr Baird Cancer office called to ask what our office needed regarding the patients records. She is going to send over a copy of the ingredients in the dye that was used for her test and her last office notes.

## 2021-04-17 ENCOUNTER — Other Ambulatory Visit: Payer: BC Managed Care – PPO

## 2021-04-21 ENCOUNTER — Other Ambulatory Visit: Payer: Self-pay

## 2021-04-21 ENCOUNTER — Ambulatory Visit
Admission: RE | Admit: 2021-04-21 | Discharge: 2021-04-21 | Disposition: A | Discharge status: Home / Self Care | Payer: BC Managed Care – PPO | Source: Ambulatory Visit | Attending: Primary Care | Admitting: Primary Care

## 2021-04-21 DIAGNOSIS — C6932 Malignant neoplasm of left choroid: Secondary | ICD-10-CM

## 2021-04-21 MED ORDER — GADOXETATE DISODIUM 0.25 MMOL/ML IV SOLN
5.0000 mL | Freq: Once | INTRAVENOUS | Status: AC | PRN
  Administered 2021-04-21: 5 mL via INTRAVENOUS

## 2021-04-22 ENCOUNTER — Other Ambulatory Visit: Payer: Self-pay | Admitting: Internal Medicine

## 2021-04-22 ENCOUNTER — Ambulatory Visit
Admission: RE | Admit: 2021-04-22 | Discharge: 2021-04-22 | Disposition: A | Discharge status: Home / Self Care | Payer: BC Managed Care – PPO | Source: Ambulatory Visit | Attending: Internal Medicine | Admitting: Internal Medicine

## 2021-04-22 DIAGNOSIS — C6932 Malignant neoplasm of left choroid: Secondary | ICD-10-CM

## 2021-04-27 ENCOUNTER — Ambulatory Visit: Payer: BC Managed Care – PPO | Admitting: Allergy & Immunology

## 2021-04-30 ENCOUNTER — Ambulatory Visit: Payer: BC Managed Care – PPO

## 2021-05-01 ENCOUNTER — Other Ambulatory Visit: Payer: Self-pay

## 2021-05-01 ENCOUNTER — Ambulatory Visit
Admission: RE | Admit: 2021-05-01 | Discharge: 2021-05-01 | Disposition: A | Discharge status: Home / Self Care | Payer: BC Managed Care – PPO | Source: Ambulatory Visit | Attending: Internal Medicine | Admitting: Internal Medicine

## 2021-05-01 DIAGNOSIS — Z1231 Encounter for screening mammogram for malignant neoplasm of breast: Secondary | ICD-10-CM

## 2021-05-20 ENCOUNTER — Telehealth: Payer: Self-pay | Admitting: Podiatry

## 2021-05-20 NOTE — Telephone Encounter (Signed)
-----   Message from Trula Slade, DPM sent at 05/18/2021  5:09 PM EDT ----- Yes, we had to redo them so she should only be charged for one pair.  ----- Message ----- From: Karsten Ro Sent: 05/18/2021   4:50 PM EDT To: Trula Slade, DPM, Junie Bame Miner  Patient sent this message on the [facility] billing website:  [name] Guarantor # [id number] Question a Specific Charge Elta Guadeloupe as Arlester Marker on May 18, 2021 - 9:16 AM EDT HI - so the reason I got new orthotics was to replace the ones I got last year, since those were faulty. They were incorrectly made and were causing my good foot pain, and did not have enough support for the "bad" foot. I wasn't under the impression that I would have to pay a copay, since it wasn't a fault of mine that the original orthotics were incorrectly made. I am sure if you ask Dr Jacqualyn Posey he will tell you what happened. I don't think that I should be charged, since if the initial pair had been correctly made, I wouldn't have needed the new pair.    Patient has a bill for $80 for the orthotics.

## 2021-05-20 NOTE — Telephone Encounter (Signed)
Left message for pt that the charges for the second pair of orthotics was an error and we have taken them out and to call if any further questions.

## 2021-07-20 ENCOUNTER — Other Ambulatory Visit: Payer: Self-pay

## 2021-07-20 ENCOUNTER — Encounter: Payer: Self-pay | Admitting: Allergy & Immunology

## 2021-07-20 ENCOUNTER — Encounter: Payer: BC Managed Care – PPO | Admitting: Allergy & Immunology

## 2021-07-20 ENCOUNTER — Telehealth: Payer: Self-pay | Admitting: Allergy & Immunology

## 2021-07-20 NOTE — Progress Notes (Deleted)
   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   Subjective:   Erin Lee is a 40 y.o. 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  Erin Lee is a 40 y.o. female presenting for {Blank single:19197::"a food challenge","a drug challenge","skin testing","a sick visit","an evaluation of ***","a follow up visit"}.  {Blank single:19197::"Asthma/Respiratory Symptom History: ***"," "}  {Blank single:19197::"Allergic Rhinitis Symptom History: ***"," "}  {Blank single:19197::"Food Allergy Symptom History: ***"," "}  {Blank single:19197::"Eczema Symptom History: ***"," "}  She is unsure when he next    Otherwise, there have been no changes to her past medical history, surgical history, family history, or social history.    ROS     Objective:   Blood pressure 108/70, pulse 80,  temperature 98.4 F (36.9 C), temperature source Temporal, height '5\' 7"'$  (1.702 m), weight 155 lb (70.3 kg), SpO2 97 %. Body mass index is 24.28 kg/m.   Physical Exam:  Physical Exam   Diagnostic studies: {Blank single:19197::"none","deferred due to recent antihistamine use","labs sent instead"," "}  Spirometry: {Blank single:19197::"results normal (FEV1: ***%, FVC: ***%, FEV1/FVC: ***%)","results abnormal (FEV1: ***%, FVC: ***%, FEV1/FVC: ***%)"}.    {Blank single:19197::"Spirometry consistent with mild obstructive disease","Spirometry consistent with moderate obstructive disease","Spirometry consistent with severe obstructive disease","Spirometry consistent with possible restrictive disease","Spirometry consistent with mixed obstructive and restrictive disease","Spirometry uninterpretable due to technique","Spirometry consistent with normal pattern"}. {Blank single:19197::"Albuterol/Atrovent nebulizer","Xopenex/Atrovent nebulizer","Albuterol nebulizer","Albuterol four puffs via MDI","Xopenex four puffs via MDI"} treatment given in clinic with {Blank single:19197::"significant improvement in FEV1 per ATS criteria","significant improvement in FVC per ATS criteria","significant improvement in FEV1 and FVC per ATS criteria","improvement in FEV1, but not significant per ATS criteria","improvement in FVC, but not significant per ATS criteria","improvement in FEV1 and FVC, but not significant per ATS criteria","no improvement"}.  Allergy Studies: {Blank single:19197::"none"," "}    {Blank single:19197::"Allergy testing results were read and interpreted by myself, documented by clinical staff."," "}      Salvatore Marvel, MD  [facility]

## 2021-07-20 NOTE — Telephone Encounter (Signed)
Patient presented for an office visit.  Unfortunately, we still do not have any of the records from her ophthalmology practice.  I canceled her appointment and she will have a credit for the next appointment.  We again requested outside records today.  I will follow-up on that he may be would drop by the practice to get the fluorescein dye.  Salvatore Marvel, MD [facility] [place]

## 2021-07-21 ENCOUNTER — Encounter: Payer: Self-pay | Admitting: Allergy & Immunology

## 2021-07-21 NOTE — Progress Notes (Signed)
This encounter was created in error - please disregard.

## 2021-07-22 NOTE — Telephone Encounter (Signed)
MyChart message sent to patient with Dr Bing Quarry update.  Thanks

## 2021-07-29 NOTE — Telephone Encounter (Signed)
Hey Dr Ernst Bowler,   Can you give the patient a call to give her an update on your research. It looks like the patient hasn't seen the mychart message I sent her.

## 2021-07-30 NOTE — Telephone Encounter (Signed)
I ordered some papers from [employer].  I forwarded them to the patient with my thought process.  That is shown below.  Hi there!   So I thought you might want to see the data from my lit review. The Kornblau et al article is the most helpful.   If you flip to page 688, there are couple of interesting things.  Under 1.4.6 (allergy testing and anaphylaxis detection), you can see the part that says: "If a drug allergy were present, the patient would demonstrate a wheal and flare on skin testing.  Multiple studies have failed to demonstrate either of these."  This goes along with what we are taught in fellowship.  The only really useful skin testing for drugs is with penicillins.  Therefore, I am not sure even if I get my hands on the fluorescein, skin testing would be relevant at all.    There is another section that says that ".prick and intradermal skin testing has not proven useful in identifying high risk patients."  Evidently there is an antibody test, although I cannot order this commercially, that was not helpful at all in determining an allergy to fluorescein.  Earlier on that same page in the first column, it says that "Although some patients with a history of adverse reactions to fluorescein do have reactions and subsequent testing and provocation, others do not."  So this says that you MAY NOT react the next time you are exposed.  I am not really sure you want to take that risk and if there is another alternative to this dye, I would probably go with that.    On page 689, it says that desensitization has been described in case that is the only option for contrast.  Desensitization is when we admit you to the hospital and give you increasing amounts of the allergenic drug, increasing the dose every 15 to 20 minutes.  This essentially is a controlled anaphylaxis of sorts.  Another option is to premedicate you for a few days before you receive the IV contrast with diphenhydramine and prednisone.  We  do this with reactions to iodinated IV contrast as well, so we could probably use that same protocol if we need to.  Let me know what you think!   Salvatore Marvel

## 2022-03-16 IMAGING — MR MR ABDOMEN WO/W CM
8 of 17 series · 24 of 48 positions shown · IV contrast (7ml eovist)
Comparison: Multiple priors including MRI October 22, 2019.

CLINICAL DATA: Follow-up history of ocular melanoma

EXAM:
MRI ABDOMEN WITHOUT AND WITH CONTRAST
TECHNIQUE: Multiplanar multisequence MR imaging of the abdomen was performed
both before and after the administration of intravenous contrast.
CONTRAST:  5mL EOVIST GADOXETATE DISODIUM 0.25 MOL/L IV SOLN

[Series 2: cor haste · coronal · 5.0mm · 0.82mm/px · 2 of 34 slices shown]
[im 1/34]
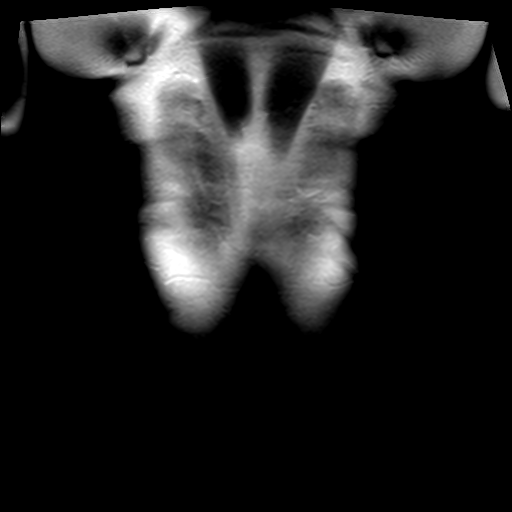
[im 34/34]
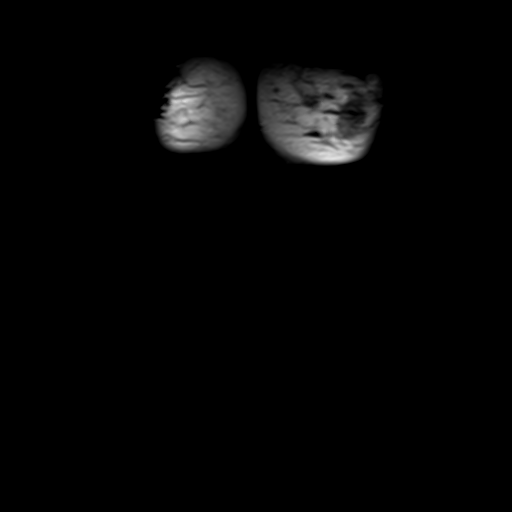

[Series 3: axial haste · axial · 5.0mm · 0.80mm/px · z∈[-158,+100]mm · 2 of 44 slices shown]
[im 1/44]
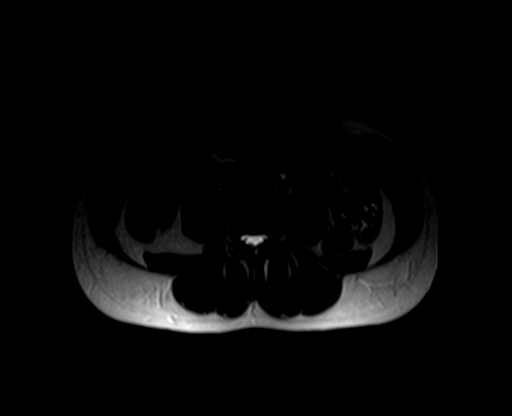
[im 44/44]
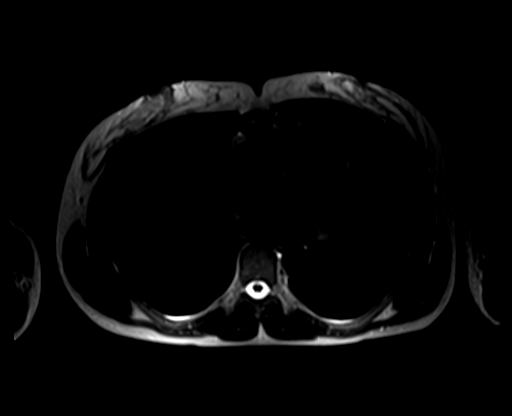

[Series 4: bSSFP · axial · 5.0mm · 0.80mm/px · z∈[-151,+94]mm · 3 of 50 slices shown]
[im 1/50]
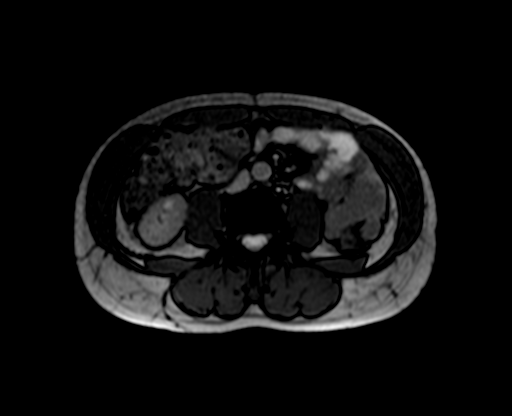
[im 25/50]
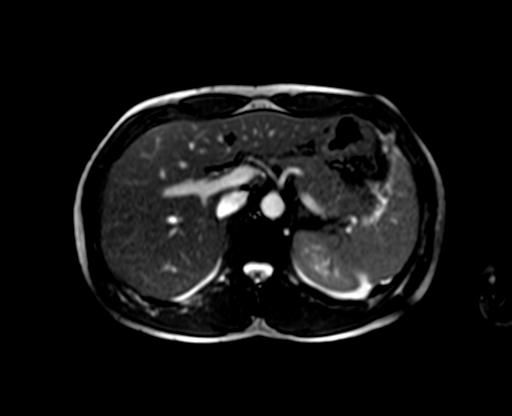
[im 50/50]
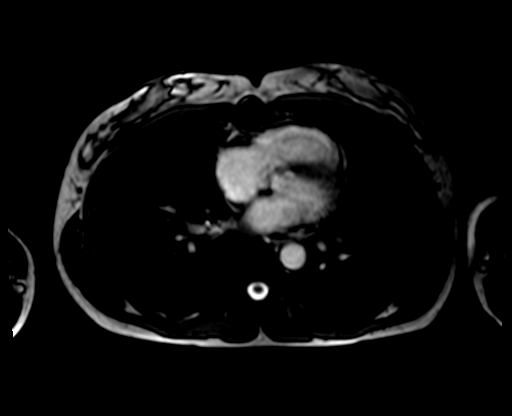

[Series 5: T1 · axial · 5.0mm · 0.80mm/px · z∈[-158,+100]mm · 4 of 88 slices shown]
[im 1/88]
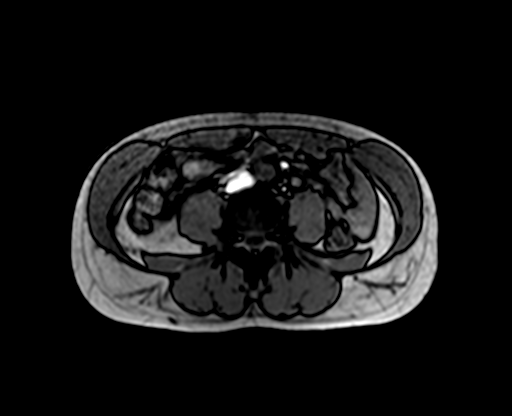
[im 30/88]
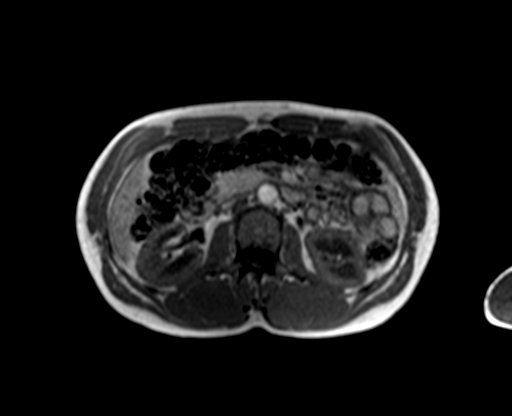
[im 59/88]
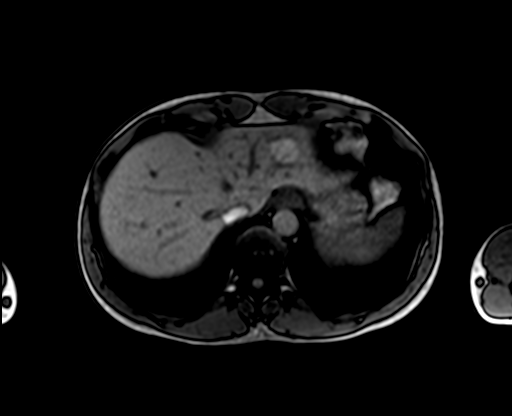
[im 88/88]
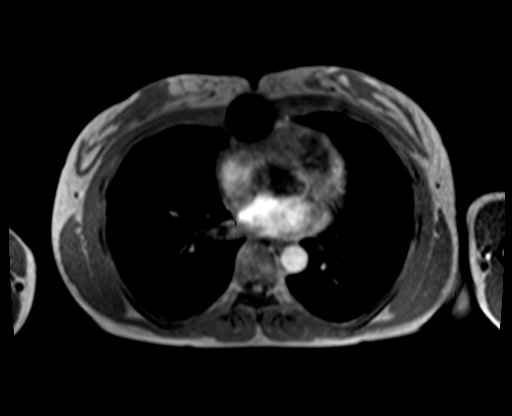

[Series 6: T1 dynamic · axial · non-contrast · 2.7mm · 0.80mm/px · z∈[-182,+96]mm · 4 of 104 slices shown]
[im 1/104]
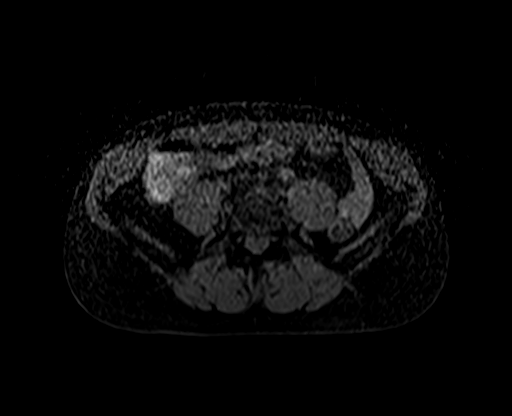
[im 35/104]
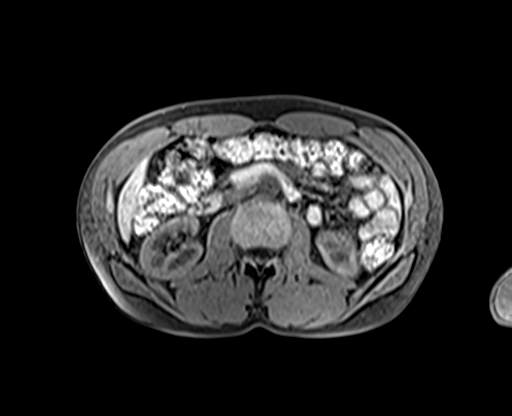
[im 69/104]
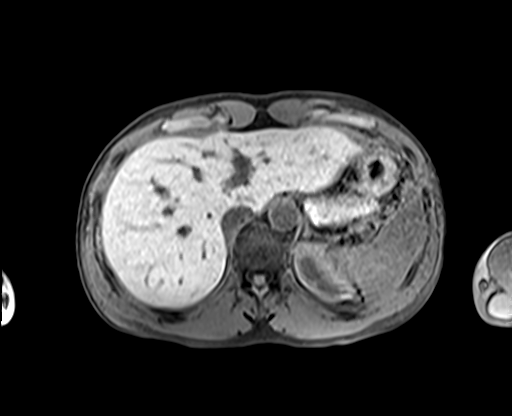
[im 104/104]
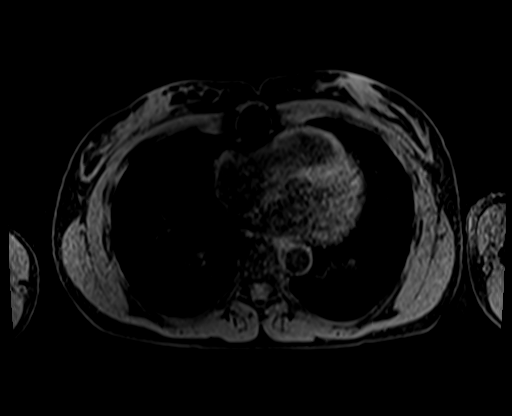

[Series 7: T1 dynamic post-contrast · axial · 2.7mm · 0.80mm/px · z∈[-182,+96]mm · 3 of 104 slices shown (1 of 3)]
[im 1/104]
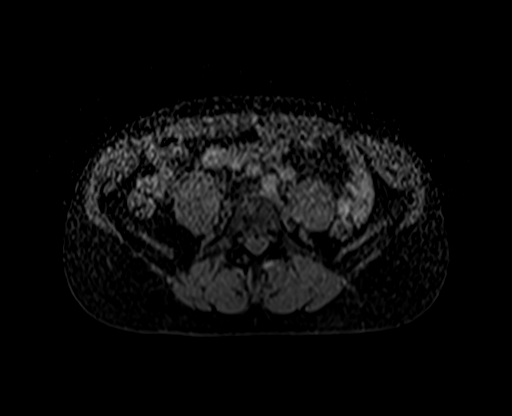
[im 52/104]
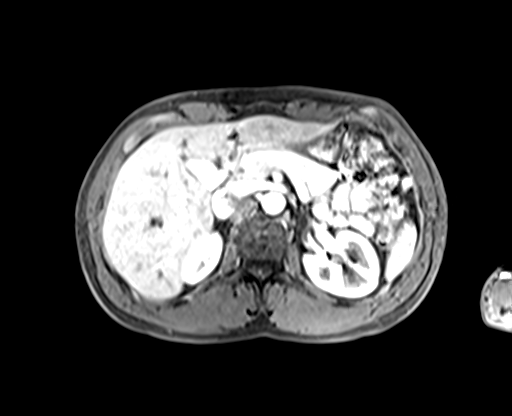
[im 104/104]
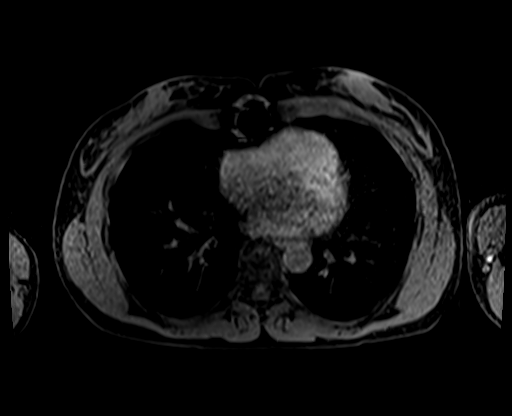

[Series 8: T1 dynamic post-contrast · axial · 2.7mm · 0.80mm/px · z∈[-182,+96]mm · 3 of 104 slices shown (2 of 3)]
[im 1/104]
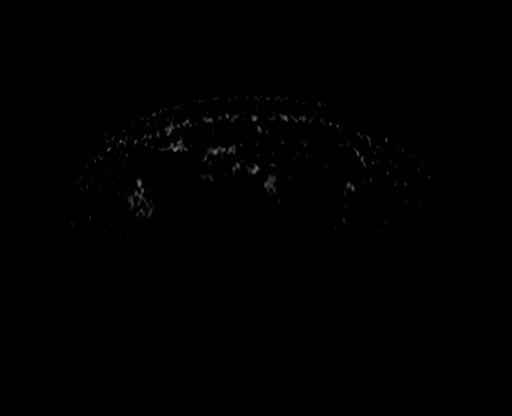
[im 52/104]
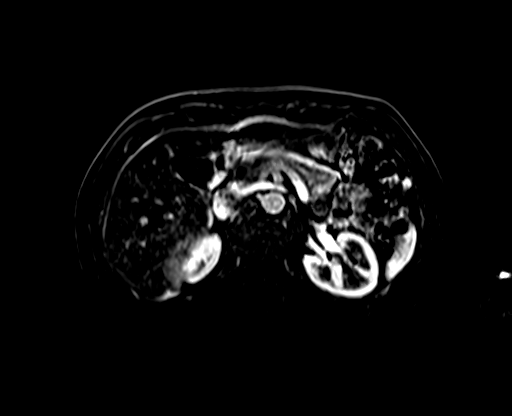
[im 104/104]
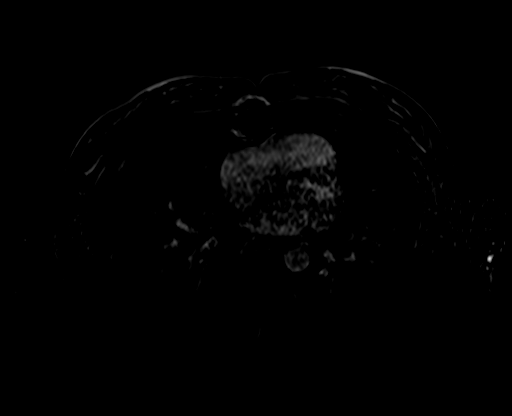

[Series 9: T1 dynamic post-contrast · axial · 2.7mm · 0.80mm/px · z∈[-182,+96]mm · 3 of 104 slices shown (3 of 3)]
[im 1/104]
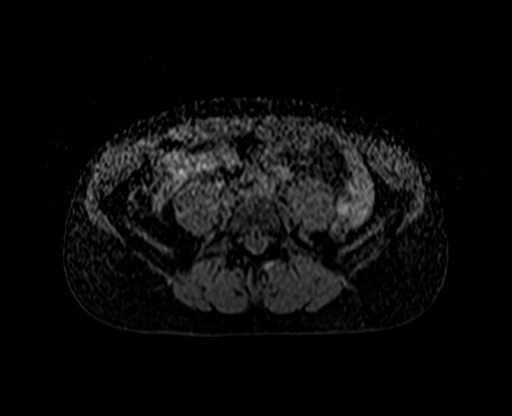
[im 52/104]
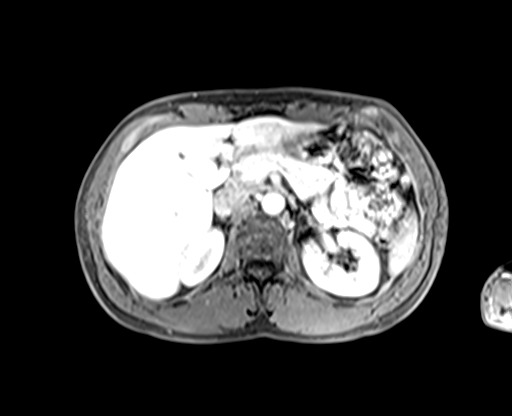
[im 104/104]
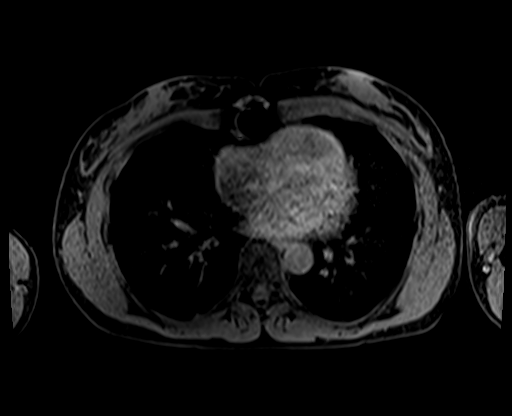

[24 of 48 positions shown; findings below may reference images not displayed]

FINDINGS: Lower chest: No acute findings.

Hepatobiliary: No suspicious hepatic lesions identified. Gallbladder
is unremarkable. No evidence of biliary ductal dilation.

Pancreas: No mass, inflammatory changes, or other parenchymal
abnormality identified.

Spleen:  Within normal limits in size and appearance.

Adrenals/Urinary Tract: No masses identified. No evidence of
hydronephrosis.

Stomach/Bowel: Visualized portions within the abdomen are
unremarkable.

Vascular/Lymphatic: No pathologically enlarged lymph nodes
identified. No abdominal aortic aneurysm demonstrated.

Other:  None.

Musculoskeletal: No suspicious bone lesions identified. Prior median
sternotomy
IMPRESSION: No evidence of metastatic disease or other acute process in the
abdomen.

## 2022-03-17 IMAGING — DX DG CHEST 2V
2 series · 2 of 2 positions shown · non-contrast
Comparison: 04/16/2020

CLINICAL DATA: History of malignant melanoma left eye.

EXAM:
CHEST - 2 VIEW

[dg chest 2 view (1 of 2)]
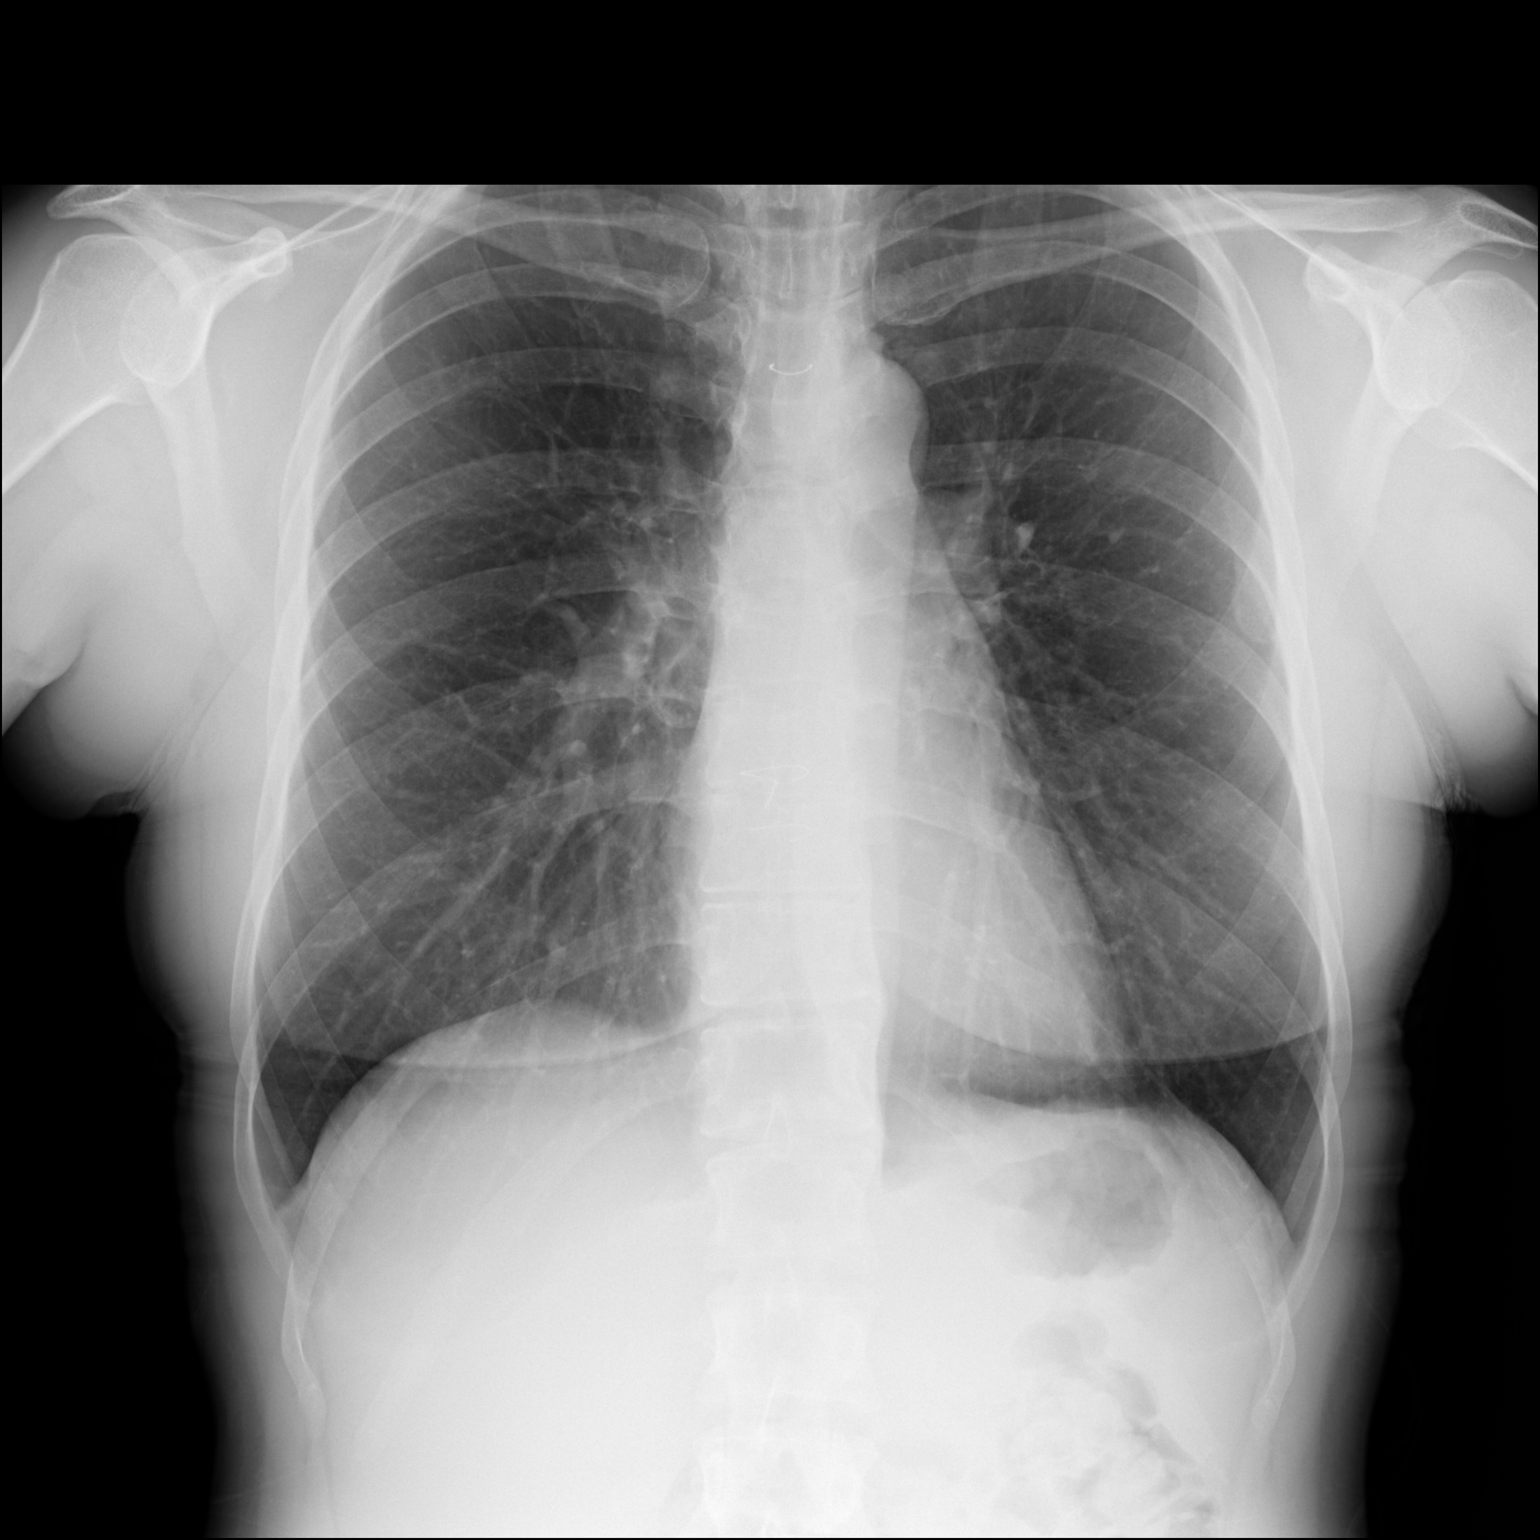

[dg chest 2 view (2 of 2)]
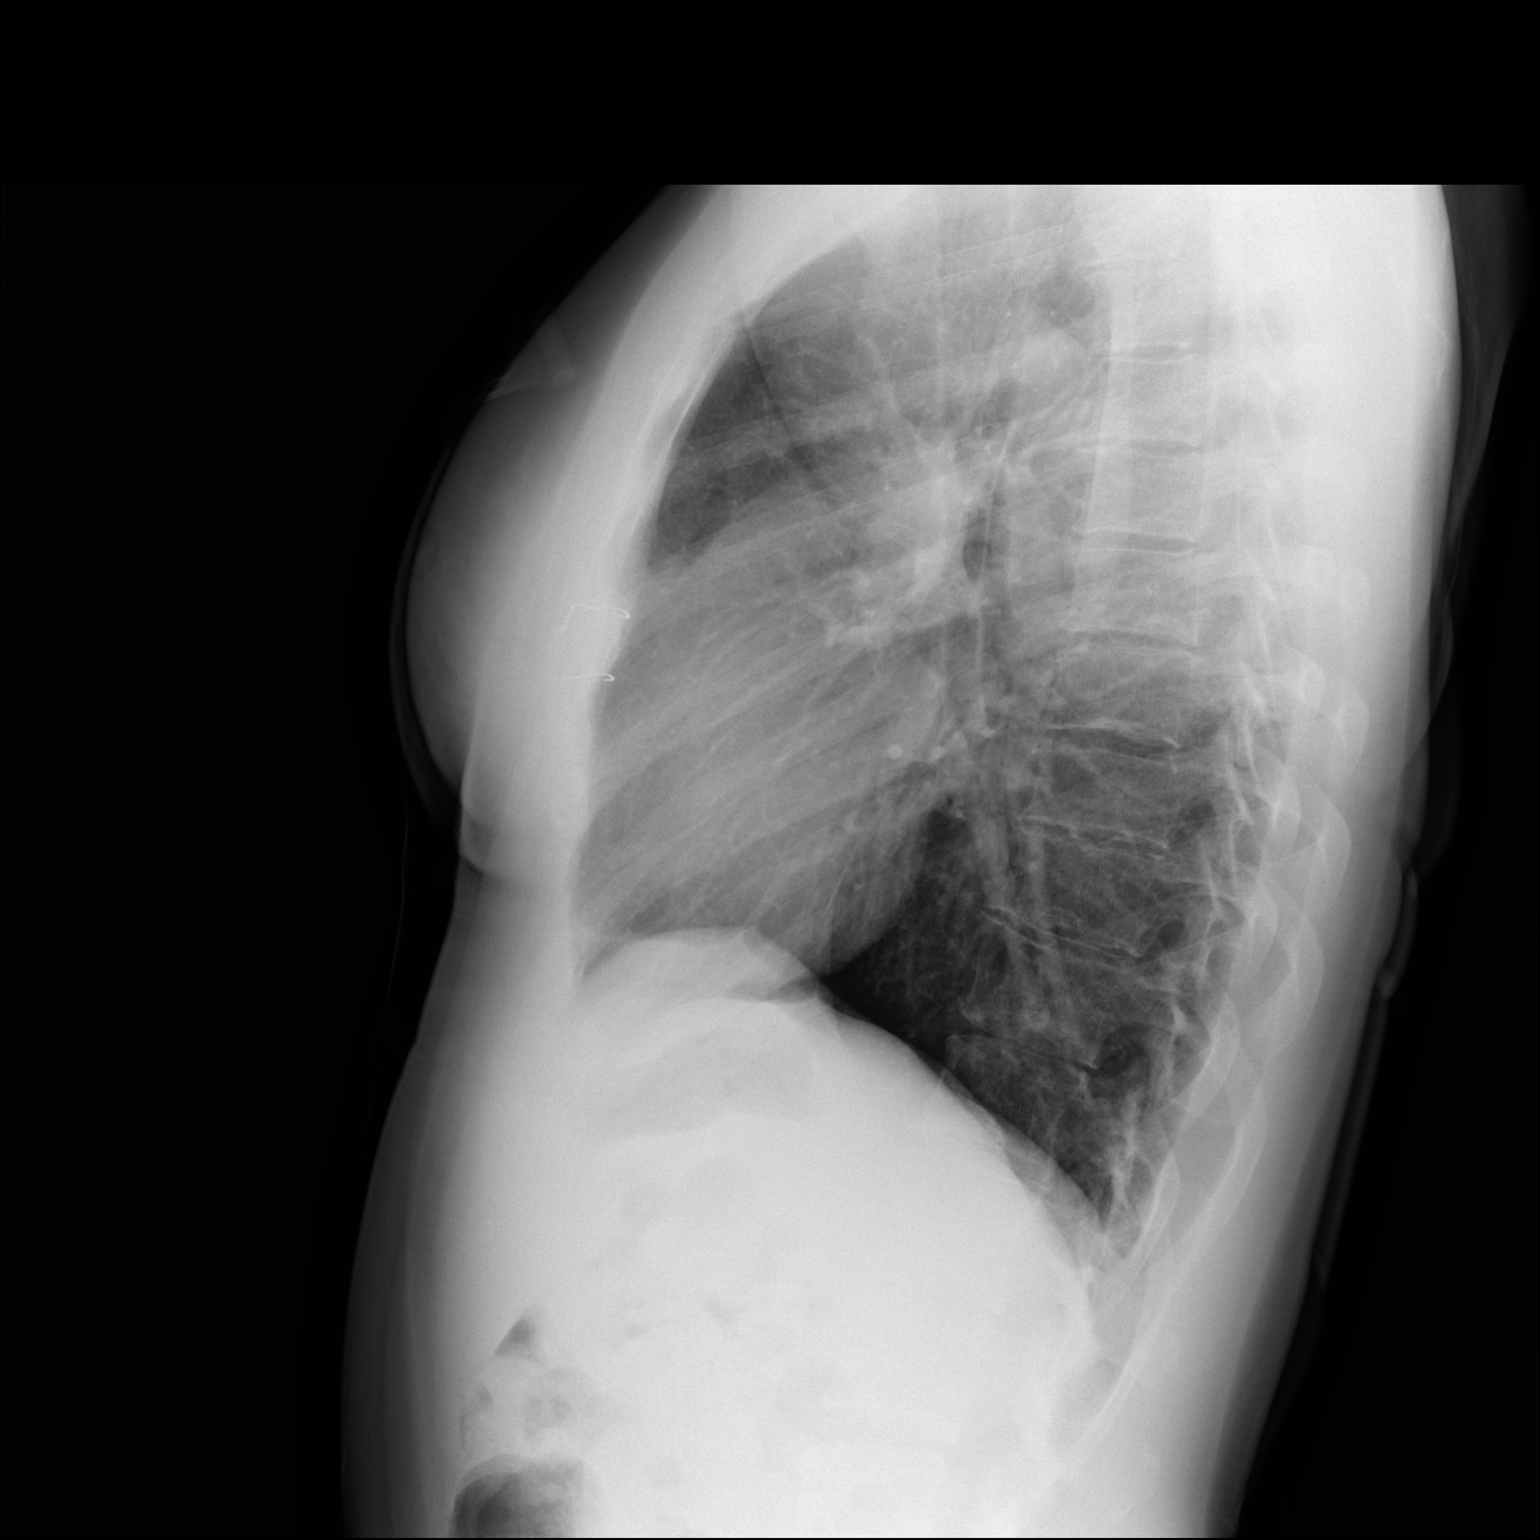

[2 of 2 positions shown; findings below may reference images not displayed]

FINDINGS: Lungs are clear. Cardiomediastinal silhouette and remainder of the
exam is unchanged.
IMPRESSION: No active cardiopulmonary disease.

## 2022-04-18 ENCOUNTER — Other Ambulatory Visit: Payer: Self-pay | Admitting: Internal Medicine

## 2022-04-18 ENCOUNTER — Other Ambulatory Visit: Payer: Self-pay | Admitting: Nurse Practitioner

## 2022-04-18 DIAGNOSIS — Z1231 Encounter for screening mammogram for malignant neoplasm of breast: Secondary | ICD-10-CM

## 2022-04-19 ENCOUNTER — Ambulatory Visit
Admission: RE | Admit: 2022-04-19 | Discharge: 2022-04-19 | Disposition: A | Discharge status: Home / Self Care | Payer: BC Managed Care – PPO | Source: Ambulatory Visit | Attending: Internal Medicine | Admitting: Internal Medicine

## 2022-04-19 ENCOUNTER — Other Ambulatory Visit: Payer: Self-pay | Admitting: Internal Medicine

## 2022-04-19 ENCOUNTER — Other Ambulatory Visit: Payer: Self-pay | Admitting: Nurse Practitioner

## 2022-04-19 DIAGNOSIS — C6932 Malignant neoplasm of left choroid: Secondary | ICD-10-CM

## 2022-04-19 DIAGNOSIS — C6990 Malignant neoplasm of unspecified site of unspecified eye: Secondary | ICD-10-CM

## 2022-05-02 ENCOUNTER — Ambulatory Visit
Admission: RE | Admit: 2022-05-02 | Discharge: 2022-05-02 | Disposition: A | Discharge status: Home / Self Care | Payer: BC Managed Care – PPO | Source: Ambulatory Visit | Attending: Nurse Practitioner | Admitting: Nurse Practitioner

## 2022-05-02 DIAGNOSIS — Z1231 Encounter for screening mammogram for malignant neoplasm of breast: Secondary | ICD-10-CM

## 2022-05-05 ENCOUNTER — Ambulatory Visit
Admission: RE | Admit: 2022-05-05 | Discharge: 2022-05-05 | Disposition: A | Discharge status: Home / Self Care | Payer: BC Managed Care – PPO | Source: Ambulatory Visit | Attending: Nurse Practitioner | Admitting: Nurse Practitioner

## 2022-05-05 DIAGNOSIS — C6932 Malignant neoplasm of left choroid: Secondary | ICD-10-CM

## 2022-05-05 DIAGNOSIS — C6990 Malignant neoplasm of unspecified site of unspecified eye: Secondary | ICD-10-CM

## 2022-05-05 MED ORDER — GADOXETATE DISODIUM 0.25 MMOL/ML IV SOLN
7.0000 mL | Freq: Once | INTRAVENOUS | Status: AC | PRN
  Administered 2022-05-05: 7 mL via INTRAVENOUS

## 2023-04-20 ENCOUNTER — Other Ambulatory Visit: Payer: Self-pay | Admitting: Internal Medicine

## 2023-04-20 DIAGNOSIS — Z1231 Encounter for screening mammogram for malignant neoplasm of breast: Secondary | ICD-10-CM

## 2023-05-18 ENCOUNTER — Ambulatory Visit
Admission: RE | Admit: 2023-05-18 | Discharge: 2023-05-18 | Disposition: A | Discharge status: Home / Self Care | Payer: BC Managed Care – PPO | Source: Ambulatory Visit

## 2023-05-18 DIAGNOSIS — Z1231 Encounter for screening mammogram for malignant neoplasm of breast: Secondary | ICD-10-CM

## 2023-12-19 NOTE — Progress Notes (Signed)
 43 y.o. female who is referred by Dr Lonni Dirk  for evaluation and follow-up of choroidal melanoma OS S/p plaque at [facility] 04/28/16.   POH: choroidal melanoma OS  low risk genetics PMH: none  Hx of cancer: choroidal melanoma OS  Hx of melanoma: choroidal melanoma OS  Family History: Cancers: lung - father, maternal grandfather, colon - paternal grandmother  Melanoma: none   Social History: (-) smoking   Last scans: MRI: 5/25 [facility]) CT/PET: none    Date Exam Ultrasound (maximum thickness) OCT Treatment/Other comments  [facility]20/20[facility]1 OD, CF@2 ' OS  IOP: 10 OU  OD wnl  OS choroidal melanoma s/p plaque, appears regressed Discontinued injections 11/2021: had multiple avastin/eylea/ozurdex     OD compact OS CME   12/28/2022 [facility]: 20/20 OD, CF at 4 ft  IOP: 14, 17   OD wnl  OS choroidal melanoma s/p plaque, appears regressed Discontinued injections 11/2021: had multiple avastin/eylea/ozurdex 2.3  OD compact OS CME   06/28/2023 [facility]: 20/20 OD, CF OS IOP: 14/14  OD: looks wnl  OS: choroidal melanoma s/p plaque, appears regressed Discontinued injections 11/2021: had multiple avastin/eylea/ozurdex B-Scan OS:  Area of treated lesion around 6:00 posterior to equator Maximal thickness including sclera: 1.8 mm (1.7 mm last time) Macular thickening measures 2.3 mm (2.3 mm last time)    [facility]: 20/20 OD, CF OS IOP: 17/16  No complaints  OD: looks wnl  OS: choroidal melanoma s/p plaque, appears regressed Discontinued injections 11/2021: had multiple avastin/eylea/ozurdex  Nevi temporal to macula with a new spot of heme B-Scan OS:  Area of treated lesion around 6:00 posterior to equator Maximal thickness including sclera: 1.6 mm (1.8 mm last time) Macular thickening measures 2.4 mm (2.3 mm last time)     Assessment/Plan: The Chief Complaint, HPI, ROS, and PFSHx as documented were reviewed with the patient and updated as  appropriate. Dx: choroidal melanoma OS S/p plaque at [facility] 04/28/16 : was told low risk genetics: do not have reports Systemic screening: 05/03/2022 MRI abdomen 1. No suspicious mass or lymphadenopathy identified in the abdomen. 2. Tiny hepatic cyst and small right renal cyst.  CT 05/17/2023 Impression: No evidence of metastasis in the chest or abdomen.  Recommend:  observe Get the genetics reports from [place] and prior pictures. Dr. Beatris in 6 months for [facility], OCT/Lesion with EDI, Clarus FP OU & AF OS, B Scan OS, sooner as needed. Continue with systemic follow up to rule out metastases: will refer based on the genetic testing. Polycarbonate glasses for protection. Counseled patient. Explained diff dx, options of treatment, risks (for growth, decreased vision, among others), benefits and alternatives. Answered all questions.   Attestation Statement:   I personally saw and evaluated the patient, and participated in the management and treatment plan as documented in the resident/fellow note.  Emil DELENA Beatris, MD

## 2024-07-09 ENCOUNTER — Telehealth: Payer: Self-pay | Admitting: Internal Medicine

## 2024-07-09 NOTE — Telephone Encounter (Signed)
 I returned Zuzu's call from a voicemail received as Caycee inquired on how to be seen here. I informed Georgeann to return my call.

## 2024-09-05 NOTE — Progress Notes (Signed)
 [facility] CONSULT NOTE  Patient Care Team: Rexanne Ingle, MD as PCP - General (Internal Medicine)  ASSESSMENT & PLAN:  Erin Lee is a 43 y.o.female with history of left uveal melanoma, insomnia, anxiety being [facility] for establish care.  Outside records report history of left eye choroidal melanoma.  Largest diameter was 12 mm.  Thickness reported 2.7 mm. She underwent radioactive plaque therapy on 04/29/2016.  Prior genetic profile showed low risk disease with risks of metastatic disease at less than 10%.  We do not have records of molecular testing.  She is otherwise feeling well, she has no systemic symptoms. 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 Continue follow-up with ophthalmology Follow-up earlier if new concerns.  Orders Placed This Encounter  Procedures   CBC with Differential (Cancer Center Only)    Standing Status:   Future    Expiration Date:   09/09/2025   CMP (Cancer Center only)    Standing Status:   Future    Expiration Date:   09/09/2025   Lactate dehydrogenase    Standing Status:   Future    Expiration Date:   09/09/2025   All questions were answered. The patient knows to call the clinic with any problems, questions or concerns. No barriers to learning was detected.  [name] Chihuahua, MD 9/29/202512:22 PM  CHIEF COMPLAINTS/PURPOSE OF CONSULTATION:  Left choroidal melanoma  HISTORY OF PRESENTING ILLNESS:  Erin Lee 43 y.o. female is here because of left choroidal melanoma  I have reviewed her chart and materials related to her cancer extensively and collaborated history with the patient. Summary of oncologic history is as follows:  History provide from outside records.  From [facility].  Reports she was  initially seen by eye doctor in 2014.  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  She was follow-up in 2017 where he was told the area grew and was referred to retina specialist.  On initial evaluation 04/25/2016 noted pigmented lesion 12 x 10 mm in the base and 2.77 mm in thickness (T1aN0M0, stage I) in the left eye.  Records report even though the lesion was stained there was signs of activity with overlying orange pigment, thickness was greater than 2 mm.  Ultrasound Hello, and it was therefore considered to be an evolving choroidal melanoma.  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    04/28/16 She underwent radioactive plaque therapy and reported biopsy specimen was obtained and sent for molecular analysis.  Report whole genome copy number analysis of DNA isolated from the biopsy demonstrated no alteration in copy number for chromosome 3, 6 or 8.  Based on genetic profile she was noted to have baseline risk of less than 10% for metastases  Given this risk assessment the suggested surveillance schedule is for the patient to have an MRI of the abdomen every 6 months for first 5 years and then every 12 months thereafter (until 2022).  03/14/2017  MRI abdomen: NED 03/31/2019 MRI abdomen: NED 05/03/2019 MRI abdomen: NED 04/28/2020 MRI abdomen: NED 12/02/2020 MRI abdomen: NED 05/17/2023 CT CAP: NED Patient last saw oncology at [facility] in 05/2023.  CT of chest, abdomen and pelvis  showed no evidence of metastases.    She last saw ophthalmology at [facility] in August 2025.  For no new concerns  She is otherwise feeling well.  Systemically no new symptoms, and denies any headaches, visual change, focal weakness, paresthesia, chest pain, shortness of breath, coughing, abdominal pain, nausea, vomiting, diarrhea, bowel changes, difficulty urinating, or other new symptoms.  MEDICAL HISTORY:  Past Medical History:  Diagnosis Date   Eye cancer  [facility])     SURGICAL HISTORY: Past Surgical History:  Procedure Laterality Date   BREAST BIOPSY Left    benign    SOCIAL HISTORY: Social History   Socioeconomic History   Marital status: Significant Other    Spouse name: Not on file   Number of children: Not on file   Years of education: Not on file   Highest education level: Not on file  Occupational History   Not on file  Tobacco Use   Smoking status: Never   Smokeless tobacco: Never  Vaping Use   Vaping status: Never Used  Substance and Sexual Activity   Alcohol use: Yes    Alcohol/week: 10.0 standard drinks of alcohol    Types: 10 Glasses of wine per week   Drug use: Never   Sexual activity: Not on file  Other Topics Concern   Not on file  Social History Narrative   Not on file   Social Drivers of Health   Financial Resource Strain: Low Risk  (09/09/2024)   Overall Financial Resource Strain (CARDIA)    Difficulty of Paying Living Expenses: Not hard at all  Food Insecurity: No Food Insecurity (09/09/2024)   Hunger Vital Sign    Worried About Running Out of Food in the Last Year: Never true    Ran Out of Food in the Last Year: Never true  Transportation Needs: No Transportation Needs (09/09/2024)   PRAPARE - Administrator, Civil Service (Medical): No    Lack of Transportation (Non-Medical): No  Physical Activity: Insufficiently Active (09/09/2024)   Exercise Vital Sign    Days of Exercise per Week: 7 days    Minutes of Exercise per Session: 20 min  Stress: Not on file  Social Connections: Unknown (05/30/2023)   Received from [employer]    How often do you feel lonely or isolated from those around you? (Adult - for ages 80 years and over): Not on file  Intimate Partner Violence: Not At Risk (09/09/2024)   Humiliation, Afraid, Rape, and Kick questionnaire    Fear of Current or Ex-Partner: No    Emotionally Abused: No    Physically Abused: No    Sexually Abused: No    FAMILY  HISTORY: CRC in PGM after 28 Father had lung cancer, smoker.  ALLERGIES:  is allergic to fluorescein and sulfa antibiotics.  MEDICATIONS:  Current Outpatient Medications  Medication Sig Dispense Refill   desvenlafaxine (PRISTIQ) 50 MG 24 hr tablet Take by mouth.     LORazepam (ATIVAN) 0.5 MG tablet Take 0.25-0.5 mg by mouth 2 (two) times daily as needed.     traZODone (DESYREL) 100 MG tablet Take 100 mg by mouth at bedtime.     triamcinolone  cream (KENALOG ) 0.1 %  (Patient not taking: Reported on 09/09/2024)     No current facility-administered medications for this visit.    REVIEW OF SYSTEMS:   All relevant systems were reviewed with the patient and are negative.  PHYSICAL EXAMINATION: ECOG PERFORMANCE STATUS: 0 - Asymptomatic  Vitals:  09/09/24 1153  BP: (!) 127/93  Pulse: 62  Resp: 13  Temp: (!) 97.5 F (36.4 C)  SpO2: 100%   Filed Weights   09/09/24 1153  Weight: 172 lb 14.4 oz (78.4 kg)    GENERAL: alert, no distress and comfortable SKIN: skin color is normal, no jaundice EYES: sclera clear NECK: supple LYMPH:  no palpable lymphadenopathy in the cervical, axillary regions LUNGS: Effort normal, no respiratory distress.  Clear to auscultation bilaterally HEART: regular rate & rhythm and no lower extremity edema ABDOMEN: soft, non-tender and nondistended   LABORATORY DATA:  New labs ordered.  RADIOGRAPHIC STUDIES: I have reviewed the radiological report.

## 2024-09-09 ENCOUNTER — Telehealth: Payer: Self-pay

## 2024-09-09 ENCOUNTER — Inpatient Hospital Stay: Payer: Self-pay

## 2024-09-09 ENCOUNTER — Other Ambulatory Visit: Payer: Self-pay

## 2024-09-09 VITALS — BP 127/93 | HR 62 | Temp 97.5°F | Resp 13 | Wt 172.9 lb

## 2024-09-09 DIAGNOSIS — Z85840 Personal history of malignant neoplasm of eye: Secondary | ICD-10-CM | POA: Insufficient documentation

## 2024-09-09 DIAGNOSIS — C6932 Malignant neoplasm of left choroid: Secondary | ICD-10-CM

## 2024-09-09 DIAGNOSIS — Z08 Encounter for follow-up examination after completed treatment for malignant neoplasm: Secondary | ICD-10-CM | POA: Diagnosis present

## 2024-09-09 LAB — CBC WITH DIFFERENTIAL (CANCER CENTER ONLY)
Abs Immature Granulocytes: 0.02 K/uL (ref 0.00–0.07)
Basophils Absolute: 0.1 K/uL (ref 0.0–0.1)
Basophils Relative: 1 %
Eosinophils Absolute: 0.1 K/uL (ref 0.0–0.5)
Eosinophils Relative: 2 %
HCT: 34.9 % — ABNORMAL LOW (ref 36.0–46.0)
Hemoglobin: 10.8 g/dL — ABNORMAL LOW (ref 12.0–15.0)
Immature Granulocytes: 0 %
Lymphocytes Relative: 27 %
Lymphs Abs: 1.6 K/uL (ref 0.7–4.0)
MCH: 24 pg — ABNORMAL LOW (ref 26.0–34.0)
MCHC: 30.9 g/dL (ref 30.0–36.0)
MCV: 77.6 fL — ABNORMAL LOW (ref 80.0–100.0)
Monocytes Absolute: 0.7 K/uL (ref 0.1–1.0)
Monocytes Relative: 12 %
Neutro Abs: 3.4 K/uL (ref 1.7–7.7)
Neutrophils Relative %: 58 %
Platelet Count: 336 K/uL (ref 150–400)
RBC: 4.5 MIL/uL (ref 3.87–5.11)
RDW: 17.2 % — ABNORMAL HIGH (ref 11.5–15.5)
WBC Count: 5.8 K/uL (ref 4.0–10.5)
nRBC: 0 % (ref 0.0–0.2)

## 2024-09-09 LAB — CMP (CANCER CENTER ONLY)
ALT: 11 U/L (ref 0–44)
AST: 20 U/L (ref 15–41)
Albumin: 4.7 g/dL (ref 3.5–5.0)
Alkaline Phosphatase: 52 U/L (ref 38–126)
Anion gap: 7 (ref 5–15)
BUN: 16 mg/dL (ref 6–20)
CO2: 28 mmol/L (ref 22–32)
Calcium: 9.5 mg/dL (ref 8.9–10.3)
Chloride: 104 mmol/L (ref 98–111)
Creatinine: 1.08 mg/dL — ABNORMAL HIGH (ref 0.44–1.00)
GFR, Estimated: >60 mL/min (ref >60)
Glucose, Bld: 98 mg/dL (ref 70–99)
Potassium: 3.7 mmol/L (ref 3.5–5.1)
Sodium: 139 mmol/L (ref 135–145)
Total Bilirubin: 0.7 mg/dL (ref 0.0–1.2)
Total Protein: 7.6 g/dL (ref 6.5–8.1)

## 2024-09-09 LAB — IRON AND IRON BINDING CAPACITY (CC-WL,HP ONLY)
Iron: 21 ug/dL — ABNORMAL LOW (ref 28–170)
Saturation Ratios: 4 % — ABNORMAL LOW (ref 10.4–31.8)
TIBC: 568 ug/dL — ABNORMAL HIGH (ref 250–450)
UIBC: 547 ug/dL — ABNORMAL HIGH (ref 148–442)

## 2024-09-09 LAB — LACTATE DEHYDROGENASE: LDH: 177 U/L (ref 98–192)

## 2024-09-09 NOTE — Telephone Encounter (Signed)
 Scheduled patient for one year return. Called and spoke with the patient, she is aware.

## 2024-09-09 NOTE — Assessment & Plan Note (Signed)
 CBC, CMP, LDH Patient will let us  know based on her insurance, where we can order MRI of abdomen with and without contrast, and chest x-ray If no concerning findings, return in 1 year. Continue follow-up with ophthalmology Follow-up earlier if new concerns.

## 2024-09-10 ENCOUNTER — Telehealth: Payer: Self-pay

## 2024-09-10 ENCOUNTER — Ambulatory Visit: Payer: Self-pay

## 2024-09-10 DIAGNOSIS — D500 Iron deficiency anemia secondary to blood loss (chronic): Secondary | ICD-10-CM

## 2024-09-10 LAB — FERRITIN: Ferritin: 7 ng/mL — ABNORMAL LOW (ref 11–307)

## 2024-09-10 NOTE — Telephone Encounter (Signed)
 Scheduled patient for labs and office visit in January. Called and spoke with the patient, she is aware.

## 2024-09-11 ENCOUNTER — Other Ambulatory Visit: Payer: Self-pay

## 2024-09-11 DIAGNOSIS — C6932 Malignant neoplasm of left choroid: Secondary | ICD-10-CM

## 2024-09-11 NOTE — Addendum Note (Signed)
 Addended by: DONAH RACE E on: 09/11/2024 11:45 AM   Modules accepted: Orders

## 2024-09-25 ENCOUNTER — Ambulatory Visit (HOSPITAL_COMMUNITY)
Admission: RE | Admit: 2024-09-25 | Discharge: 2024-09-25 | Disposition: A | Discharge status: Home / Self Care | Source: Ambulatory Visit

## 2024-09-25 DIAGNOSIS — C6932 Malignant neoplasm of left choroid: Secondary | ICD-10-CM | POA: Diagnosis present

## 2024-09-25 MED ORDER — GADOBUTROL 1 MMOL/ML IV SOLN
7.0000 mL | Freq: Once | INTRAVENOUS | Status: AC | PRN
  Administered 2024-09-25: 7 mL via INTRAVENOUS

## 2024-09-26 ENCOUNTER — Ambulatory Visit: Payer: Self-pay

## 2024-09-26 NOTE — Telephone Encounter (Signed)
-----   Message from [name] Chihuahua sent at 09/26/2024  8:31 AM EDT ----- Zorita please let her know good news. No cancer spread to the liver.  Thank you ----- Message ----- From: Interface, Rad Results In Sent: 09/25/2024   8:48 AM EDT To: [name] Chihuahua, MD

## 2024-09-26 NOTE — Telephone Encounter (Signed)
 LM with note below. To call if any questions.

## 2024-12-11 ENCOUNTER — Other Ambulatory Visit: Payer: Self-pay | Admitting: *Deleted

## 2024-12-11 DIAGNOSIS — C6932 Malignant neoplasm of left choroid: Secondary | ICD-10-CM

## 2024-12-13 ENCOUNTER — Inpatient Hospital Stay

## 2024-12-17 ENCOUNTER — Inpatient Hospital Stay

## 2024-12-24 ENCOUNTER — Inpatient Hospital Stay

## 2024-12-26 ENCOUNTER — Inpatient Hospital Stay

## 2024-12-30 ENCOUNTER — Inpatient Hospital Stay

## 2024-12-30 ENCOUNTER — Ambulatory Visit (HOSPITAL_COMMUNITY)
Admission: RE | Admit: 2024-12-30 | Discharge: 2024-12-30 | Disposition: A | Discharge status: Home / Self Care | Source: Ambulatory Visit

## 2024-12-30 DIAGNOSIS — D500 Iron deficiency anemia secondary to blood loss (chronic): Secondary | ICD-10-CM

## 2024-12-30 DIAGNOSIS — C6932 Malignant neoplasm of left choroid: Secondary | ICD-10-CM | POA: Insufficient documentation

## 2024-12-30 LAB — CBC WITH DIFFERENTIAL (CANCER CENTER ONLY)
Abs Immature Granulocytes: 0.01 K/uL (ref 0.00–0.07)
Basophils Absolute: 0.1 K/uL (ref 0.0–0.1)
Basophils Relative: 1 %
Eosinophils Absolute: 0.1 K/uL (ref 0.0–0.5)
Eosinophils Relative: 3 %
HCT: 33.1 % — ABNORMAL LOW (ref 36.0–46.0)
Hemoglobin: 10.2 g/dL — ABNORMAL LOW (ref 12.0–15.0)
Immature Granulocytes: 0 %
Lymphocytes Relative: 28 %
Lymphs Abs: 1.3 K/uL (ref 0.7–4.0)
MCH: 23.4 pg — ABNORMAL LOW (ref 26.0–34.0)
MCHC: 30.8 g/dL (ref 30.0–36.0)
MCV: 76.1 fL — ABNORMAL LOW (ref 80.0–100.0)
Monocytes Absolute: 0.4 K/uL (ref 0.1–1.0)
Monocytes Relative: 9 %
Neutro Abs: 2.7 K/uL (ref 1.7–7.7)
Neutrophils Relative %: 59 %
Platelet Count: 377 K/uL (ref 150–400)
RBC: 4.35 MIL/uL (ref 3.87–5.11)
RDW: 15.4 % (ref 11.5–15.5)
WBC Count: 4.7 K/uL (ref 4.0–10.5)
nRBC: 0 % (ref 0.0–0.2)

## 2024-12-30 LAB — IRON AND IRON BINDING CAPACITY (CC-WL,HP ONLY)
Iron: 73 ug/dL (ref 28–170)
Saturation Ratios: 13 % (ref 10.4–31.8)
TIBC: 549 ug/dL — ABNORMAL HIGH (ref 250–450)
UIBC: 476 ug/dL

## 2024-12-30 LAB — FERRITIN: Ferritin: 6 ng/mL — ABNORMAL LOW (ref 11–307)

## 2025-01-02 NOTE — Progress Notes (Signed)
 [facility] OFFICE PROGRESS NOTE  Patient Care Team: Rexanne Ingle, MD as PCP - General (Internal Medicine)  Erin Lee is a 44 y.o.female with history of left uveal melanoma, insomnia, anxiety being [facility] for establish care.   Outside records report history of left eye choroidal melanoma.  Largest diameter was 12 mm.  Thickness reported 2.7 mm. She underwent radioactive plaque therapy on 04/29/2016.  Prior genetic profile showed low risk disease with risks of metastatic disease at less than 10%.  We do not have records of molecular testing.   She is otherwise feeling well, she has no systemic symptoms.  Lab showed IDA. She noted her menstrual cycle has been a lot heavier. She started iron  supplement after seeing her iron  study.   She has worsening menstrual bleeding, persistent iron  deficiency.  Her hemoglobin is worse in terms of anemia.  Given there is no improvement on oral iron , discussed IV iron  with Venofer .  Discussed potential allergic reaction such as shortness of breath, coughing, itchy throat, rash, back pain, nausea, vomiting.  People can also get headaches.  Rarely patient needs medication to reverse allergic reaction, and potential emergency room visit.  She understands and would like to proceed.  She is moving to [place] in March and will not follow-up with us  again.  Advised her to seek a new physician, repeat labs, and continue follow-up for his history of ocular melanoma.  She is already planning on this. Assessment & Plan Iron  deficiency anemia due to chronic blood loss Ordering IV iron  Venofer  200 mg x 5 doses Patient will follow-up with new doctor after moving to [place] If recurrent iron  deficiency, and without heavy menstrual bleeding, or resolution of heavy menstrual bleeding with persistent iron  deficiency, then GI tract evaluation is indicated. 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   Continue follow-up with ophthalmology and oncology once moving to [place]  Patient is moving to [place], will not follow-up with us  again.   [name] Chihuahua, MD  INTERVAL HISTORY: Patient returns for follow-up.  Report she feels fatigue especially with work outs. Periods are heavy and on a new birth control to help with bleeding. She had a 10 days bleeding and then multiple periods since then in 16 days. She woke up in the middle of night with dripping. It is often. She has a fibroid and hoping remove that.   Oncology history: History provide from outside records.  From [facility]   Reports she was initially seen by eye doctor in 2014.  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  She was follow-up in 2017 where he was told the area grew and was referred to retina specialist.  On initial evaluation 04/25/2016 noted pigmented lesion 12 x 10 mm in the base and 2.77 mm in thickness (T1aN0M0, stage I) in the left eye.  Records report even though the lesion was stained there was signs of activity with overlying orange pigment, thickness was greater than 2 mm.  Ultrasound Hello, and it was therefore considered to be an evolving choroidal melanoma.  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     04/28/16 She underwent radioactive plaque therapy and reported biopsy specimen was obtained and sent for molecular analysis.  Report whole genome copy number analysis of DNA isolated from the biopsy demonstrated no alteration  in copy number for chromosome 3, 6 or 8.  Based on genetic profile she was noted to have baseline risk of less than 10% for metastases   Given this risk assessment the suggested surveillance schedule is for the patient to have an MRI of the abdomen every 6 months for first 5 years and then every 12 months thereafter (until 2022).   03/14/2017  MRI abdomen: NED 03/31/2019 MRI abdomen:  NED 05/03/2019 MRI abdomen: NED 04/28/2020 MRI abdomen: NED 12/02/2020 MRI abdomen: NED 05/17/2023 CT CAP: NED Patient last saw oncology at [facility] in 05/2023.  CT of chest, abdomen and pelvis showed no evidence of metastases.     She last saw ophthalmology at [facility] in August 2025.  For no new concerns  09/09/24 first visit at [facility].  No systemic symptoms or findings 09/25/2024 MRI abdomen with and without contrast: No evidence of metastases. Redemonstration of a 7 x 9 mm simple cyst in the right kidney lower pole There is a stable 5 mm simple cyst in the right hepatic lobe segment 8. No suspicious liver lesion.   PHYSICAL EXAMINATION: ECOG PERFORMANCE STATUS: 0  Vitals:   01/03/25 0833  BP: 124/88  Pulse: 73  Resp: 17  Temp: 97.7 F (36.5 C)  SpO2: 100%   Filed Weights   01/03/25 0833  Weight: 175 lb (79.4 kg)    GENERAL: alert, no distress and comfortable SKIN: skin color slightly pale and no jaundice  EYES: normal, sclera clear   Relevant data reviewed during this visit included labs.  New infusion ordered.

## 2025-01-03 ENCOUNTER — Other Ambulatory Visit (HOSPITAL_COMMUNITY): Payer: Self-pay

## 2025-01-03 ENCOUNTER — Telehealth: Payer: Self-pay | Admitting: Pharmacy Technician

## 2025-01-03 ENCOUNTER — Inpatient Hospital Stay

## 2025-01-03 VITALS — BP 124/88 | HR 73 | Temp 97.7°F | Resp 17 | Ht 67.0 in | Wt 175.0 lb

## 2025-01-03 DIAGNOSIS — C6932 Malignant neoplasm of left choroid: Secondary | ICD-10-CM

## 2025-01-03 DIAGNOSIS — D509 Iron deficiency anemia, unspecified: Secondary | ICD-10-CM | POA: Insufficient documentation

## 2025-01-03 DIAGNOSIS — D500 Iron deficiency anemia secondary to blood loss (chronic): Secondary | ICD-10-CM | POA: Diagnosis not present

## 2025-01-03 NOTE — Assessment & Plan Note (Signed)
 Ordering IV iron Venofer 200 mg x 5 doses Patient will follow-up with new doctor after moving to [place] If recurrent iron deficiency, and without heavy menstrual bleeding, or resolution of heavy menstrual bleeding with persistent iron deficiency, then GI tract evaluation is indicated.

## 2025-01-03 NOTE — Assessment & Plan Note (Addendum)
 Last MRI of abdomen 09/25/2024 showed no evidence of metastases.   Continue follow-up with ophthalmology and oncology once moving to [place]

## 2025-01-03 NOTE — Telephone Encounter (Signed)
" °  Dr. Tina, the patient will be scheduled as soon as possible.  Auth Submission: NO AUTH NEEDED Site of care: Site of care: [facility] Payer: [employer] Medication & CPT/J Code(s) submitted: Venofer (Iron Sucrose) J1756 Diagnosis Code:  D50.9  Route of submission (phone, fax, portal): n/a  Phone # Fax # Auth type: Buy/Bill PB Units/visits requested: 200 mg x 5 Reference number: n/a Approval from: 01/03/2025 to 04/10/2025       "

## 2025-01-08 ENCOUNTER — Ambulatory Visit

## 2025-01-08 VITALS — BP 130/88 | HR 61 | Temp 98.8°F | Resp 16 | Ht 67.0 in | Wt 182.0 lb

## 2025-01-08 DIAGNOSIS — D500 Iron deficiency anemia secondary to blood loss (chronic): Secondary | ICD-10-CM | POA: Diagnosis not present

## 2025-01-08 MED ORDER — IRON SUCROSE 200 MG IVPB - SIMPLE MED
200.0000 mg | Freq: Once | Status: AC
  Administered 2025-01-08: 200 mg via INTRAVENOUS

## 2025-01-08 NOTE — Progress Notes (Signed)
 Diagnosis: , Acute Anemia  Provider:  Lonna Coder MD  Procedure: IV Infusion  IV Type: Peripheral, IV Location: R Forearm  Venofer  (Iron  Sucrose), Dose: 200 mg  Infusion Start Time: 0833  Infusion Stop Time: 9147  Post Infusion IV Care: Observation period completed and Peripheral IV Discontinued  Discharge: Condition: Good, Destination: Home . AVS Declined  Performed by:  Donny Childes, RN

## 2025-01-08 NOTE — Patient Instructions (Signed)

## 2025-01-10 ENCOUNTER — Ambulatory Visit (INDEPENDENT_AMBULATORY_CARE_PROVIDER_SITE_OTHER)

## 2025-01-10 VITALS — BP 132/82 | HR 67 | Temp 98.4°F | Resp 16 | Ht 67.0 in

## 2025-01-10 DIAGNOSIS — D500 Iron deficiency anemia secondary to blood loss (chronic): Secondary | ICD-10-CM | POA: Diagnosis not present

## 2025-01-10 MED ORDER — IRON SUCROSE 200 MG IVPB - SIMPLE MED
200.0000 mg | Freq: Once | Status: AC
  Administered 2025-01-10: 200 mg via INTRAVENOUS

## 2025-01-10 NOTE — Progress Notes (Signed)
 Diagnosis: Iron  Deficiency Anemia  Provider:  Mannam, Praveen MD  Procedure: IV Infusion  IV Type: Peripheral, IV Location: L Antecubital  Venofer  (Iron  Sucrose), Dose: 200 mg  Infusion Start Time: 0837  Infusion Stop Time: 0857  Post Infusion IV Care: Observation period completed and Peripheral IV Discontinued  Discharge: Condition: Good, Destination: Home . AVS Declined  Performed by:  Rocky FORBES Search, RN

## 2025-01-13 ENCOUNTER — Ambulatory Visit

## 2025-01-15 ENCOUNTER — Ambulatory Visit

## 2025-01-15 VITALS — BP 123/82 | HR 70 | Temp 98.3°F | Resp 16 | Ht 67.0 in | Wt 182.0 lb

## 2025-01-15 DIAGNOSIS — D500 Iron deficiency anemia secondary to blood loss (chronic): Secondary | ICD-10-CM

## 2025-01-15 MED ORDER — IRON SUCROSE 200 MG IVPB - SIMPLE MED
200.0000 mg | Freq: Once | Status: AC
  Administered 2025-01-15: 200 mg via INTRAVENOUS

## 2025-01-15 NOTE — Progress Notes (Signed)
 Diagnosis: Iron  Deficiency Anemia  Provider:  Mannam, Praveen MD  Procedure: IV Infusion  IV Type: Peripheral, IV Location: R Antecubital  Venofer  (Iron  Sucrose), Dose: 200 mg  Infusion Start Time: 0902  Infusion Stop Time: 0918  Post Infusion IV Care: Patient declined observation and Peripheral IV Discontinued  Discharge: Condition: Good, Destination: Home . AVS Declined  Performed by:  Shaguana Love E, RN

## 2025-01-17 ENCOUNTER — Ambulatory Visit

## 2025-01-17 VITALS — BP 123/77 | HR 66 | Temp 98.2°F | Resp 18 | Ht 67.0 in | Wt 182.0 lb

## 2025-01-17 DIAGNOSIS — D500 Iron deficiency anemia secondary to blood loss (chronic): Secondary | ICD-10-CM

## 2025-01-17 MED ORDER — IRON SUCROSE 200 MG IVPB - SIMPLE MED
200.0000 mg | Freq: Once | Status: AC
  Administered 2025-01-17: 200 mg via INTRAVENOUS

## 2025-01-17 NOTE — Progress Notes (Signed)
 Diagnosis: , Iron  Deficiency Anemia  Provider:  Mannam, Praveen MD  Procedure: IV Infusion  IV Type: Peripheral, IV Location: L Antecubital  , Venofer  (Iron  Sucrose), Dose: 200 mg  Infusion Start Time: 0857  Infusion Stop Time: 0913  Post Infusion IV Care: Patient declined observation and Peripheral IV Discontinued  Discharge: Condition: Good, Destination: Home . AVS Declined  Performed by:  Antuane Eastridge, RN

## 2025-01-20 ENCOUNTER — Ambulatory Visit

## 2025-02-14 ENCOUNTER — Ambulatory Visit (HOSPITAL_COMMUNITY): Admit: 2025-02-14 | Admitting: Obstetrics and Gynecology

## 2025-09-09 ENCOUNTER — Ambulatory Visit

## 2025-09-09 ENCOUNTER — Other Ambulatory Visit
